# Patient Record
Sex: Female | Born: 1960 | Race: White | Hispanic: No | Smoking: Never smoker
Health system: Southern US, Community
[De-identification: ages and names within clinical notes are randomized; demographics above are authoritative.]

## PROBLEM LIST (undated history)

## (undated) DIAGNOSIS — F419 Anxiety disorder, unspecified: Secondary | ICD-10-CM

## (undated) DIAGNOSIS — F988 Other specified behavioral and emotional disorders with onset usually occurring in childhood and adolescence: Secondary | ICD-10-CM

## (undated) DIAGNOSIS — IMO0001 Reserved for inherently not codable concepts without codable children: Secondary | ICD-10-CM

## (undated) DIAGNOSIS — F32A Depression, unspecified: Secondary | ICD-10-CM

## (undated) DIAGNOSIS — T8859XA Other complications of anesthesia, initial encounter: Secondary | ICD-10-CM

## (undated) DIAGNOSIS — Z973 Presence of spectacles and contact lenses: Secondary | ICD-10-CM

## (undated) DIAGNOSIS — F329 Major depressive disorder, single episode, unspecified: Secondary | ICD-10-CM

## (undated) DIAGNOSIS — R413 Other amnesia: Secondary | ICD-10-CM

## (undated) DIAGNOSIS — C50919 Malignant neoplasm of unspecified site of unspecified female breast: Secondary | ICD-10-CM

## (undated) DIAGNOSIS — T4145XA Adverse effect of unspecified anesthetic, initial encounter: Secondary | ICD-10-CM

## (undated) DIAGNOSIS — IMO0002 Reserved for concepts with insufficient information to code with codable children: Secondary | ICD-10-CM

## (undated) HISTORY — PX: BREAST SURGERY: SHX581

## (undated) HISTORY — DX: Reserved for concepts with insufficient information to code with codable children: IMO0002

## (undated) HISTORY — PX: OTHER SURGICAL HISTORY: SHX169

## (undated) HISTORY — DX: Major depressive disorder, single episode, unspecified: F32.9

## (undated) HISTORY — DX: Other specified behavioral and emotional disorders with onset usually occurring in childhood and adolescence: F98.8

## (undated) HISTORY — DX: Other amnesia: R41.3

## (undated) HISTORY — DX: Anxiety disorder, unspecified: F41.9

## (undated) HISTORY — DX: Depression, unspecified: F32.A

## (undated) HISTORY — DX: Malignant neoplasm of unspecified site of unspecified female breast: C50.919

## (undated) HISTORY — PX: MULTIPLE TOOTH EXTRACTIONS: SHX2053

## (undated) HISTORY — DX: Reserved for inherently not codable concepts without codable children: IMO0001

---

## 1999-12-25 ENCOUNTER — Encounter: Payer: Self-pay | Admitting: Obstetrics and Gynecology

## 1999-12-25 ENCOUNTER — Encounter: Admission: RE | Admit: 1999-12-25 | Discharge: 1999-12-25 | Payer: Self-pay | Admitting: Obstetrics and Gynecology

## 2001-01-20 ENCOUNTER — Encounter: Admission: RE | Admit: 2001-01-20 | Discharge: 2001-01-20 | Payer: Self-pay | Admitting: Obstetrics and Gynecology

## 2001-01-20 ENCOUNTER — Encounter: Payer: Self-pay | Admitting: Obstetrics and Gynecology

## 2002-01-27 ENCOUNTER — Encounter: Admission: RE | Admit: 2002-01-27 | Discharge: 2002-01-27 | Payer: Self-pay | Admitting: Obstetrics and Gynecology

## 2002-01-27 ENCOUNTER — Encounter: Payer: Self-pay | Admitting: Obstetrics and Gynecology

## 2002-03-12 ENCOUNTER — Encounter: Admission: RE | Admit: 2002-03-12 | Discharge: 2002-03-12 | Payer: Self-pay | Admitting: Family Medicine

## 2002-03-12 ENCOUNTER — Encounter: Payer: Self-pay | Admitting: Family Medicine

## 2002-09-01 ENCOUNTER — Encounter: Payer: Self-pay | Admitting: Family Medicine

## 2002-09-01 ENCOUNTER — Encounter: Admission: RE | Admit: 2002-09-01 | Discharge: 2002-09-01 | Payer: Self-pay | Admitting: Family Medicine

## 2003-02-10 ENCOUNTER — Encounter: Admission: RE | Admit: 2003-02-10 | Discharge: 2003-02-10 | Payer: Self-pay | Admitting: Obstetrics and Gynecology

## 2003-02-10 ENCOUNTER — Encounter: Payer: Self-pay | Admitting: Obstetrics and Gynecology

## 2004-02-22 ENCOUNTER — Ambulatory Visit (HOSPITAL_COMMUNITY): Admission: RE | Admit: 2004-02-22 | Discharge: 2004-02-22 | Payer: Self-pay | Admitting: Obstetrics and Gynecology

## 2005-03-06 ENCOUNTER — Ambulatory Visit (HOSPITAL_COMMUNITY): Admission: RE | Admit: 2005-03-06 | Discharge: 2005-03-06 | Payer: Self-pay | Admitting: Obstetrics and Gynecology

## 2006-03-20 ENCOUNTER — Ambulatory Visit (HOSPITAL_COMMUNITY): Admission: RE | Admit: 2006-03-20 | Discharge: 2006-03-20 | Payer: Self-pay | Admitting: Obstetrics and Gynecology

## 2007-03-24 ENCOUNTER — Ambulatory Visit (HOSPITAL_COMMUNITY): Admission: RE | Admit: 2007-03-24 | Discharge: 2007-03-24 | Payer: Self-pay | Admitting: Obstetrics and Gynecology

## 2008-04-13 ENCOUNTER — Ambulatory Visit (HOSPITAL_COMMUNITY): Admission: RE | Admit: 2008-04-13 | Discharge: 2008-04-13 | Payer: Self-pay | Admitting: Obstetrics and Gynecology

## 2009-05-11 ENCOUNTER — Ambulatory Visit (HOSPITAL_COMMUNITY): Admission: RE | Admit: 2009-05-11 | Discharge: 2009-05-11 | Payer: Self-pay | Admitting: Obstetrics and Gynecology

## 2009-08-01 ENCOUNTER — Other Ambulatory Visit: Admission: RE | Admit: 2009-08-01 | Discharge: 2009-08-01 | Payer: Self-pay | Admitting: Gynecology

## 2009-08-01 ENCOUNTER — Encounter: Payer: Self-pay | Admitting: Gynecology

## 2009-08-01 ENCOUNTER — Ambulatory Visit: Payer: Self-pay | Admitting: Gynecology

## 2009-10-25 ENCOUNTER — Ambulatory Visit: Payer: Self-pay | Admitting: Gynecology

## 2010-06-21 ENCOUNTER — Ambulatory Visit (HOSPITAL_COMMUNITY): Admission: RE | Admit: 2010-06-21 | Discharge: 2010-06-21 | Payer: Self-pay | Admitting: Gynecology

## 2010-06-28 ENCOUNTER — Encounter: Admission: RE | Admit: 2010-06-28 | Discharge: 2010-06-28 | Payer: Self-pay | Admitting: Gynecology

## 2010-08-16 ENCOUNTER — Other Ambulatory Visit: Admission: RE | Admit: 2010-08-16 | Discharge: 2010-08-16 | Payer: Self-pay | Admitting: Gynecology

## 2010-08-16 ENCOUNTER — Ambulatory Visit: Payer: Self-pay | Admitting: Gynecology

## 2010-11-12 ENCOUNTER — Encounter: Payer: Self-pay | Admitting: Gynecology

## 2011-05-21 ENCOUNTER — Encounter: Payer: Self-pay | Admitting: Podiatry

## 2011-05-31 ENCOUNTER — Other Ambulatory Visit: Payer: Self-pay | Admitting: Gynecology

## 2011-05-31 DIAGNOSIS — Z1231 Encounter for screening mammogram for malignant neoplasm of breast: Secondary | ICD-10-CM

## 2011-07-05 ENCOUNTER — Ambulatory Visit (HOSPITAL_COMMUNITY)
Admission: RE | Admit: 2011-07-05 | Discharge: 2011-07-05 | Disposition: A | Discharge status: Home / Self Care | Payer: Managed Care, Other (non HMO) | Source: Ambulatory Visit | Attending: Gynecology | Admitting: Gynecology

## 2011-07-05 DIAGNOSIS — Z1231 Encounter for screening mammogram for malignant neoplasm of breast: Secondary | ICD-10-CM | POA: Insufficient documentation

## 2011-07-23 ENCOUNTER — Telehealth: Payer: Self-pay | Admitting: *Deleted

## 2011-07-23 NOTE — Telephone Encounter (Signed)
Pt informed with the below note and will follow up with psychologist.

## 2011-07-23 NOTE — Telephone Encounter (Signed)
The patient really needs to call her psychologist to be evaluated by them. Perimenopausal hormonal changes certainly complaining into emotions and we can discuss this at her annual exam I think if she is acutely stressed she needs to followup with them.

## 2011-07-23 NOTE — Telephone Encounter (Signed)
Pt called and was very emotional. Her annual is scheduled for nov.6. She has recently been diagnosed with bipolar by her psychologist. Pt was crying on phone stating that her marriage is not going well, husband had cancer last year. She also has ADD and on various medication to help with this issue. Pt doesn't believe that she is bipolar and thinks it related to hormone related. She is amenorrheic for about 2 years now. Pt wants your opinion on what she should do to help with this emotional issue. Please advise.

## 2011-08-27 ENCOUNTER — Encounter: Payer: Self-pay | Admitting: *Deleted

## 2011-08-28 ENCOUNTER — Ambulatory Visit (INDEPENDENT_AMBULATORY_CARE_PROVIDER_SITE_OTHER): Payer: Managed Care, Other (non HMO) | Admitting: Gynecology

## 2011-08-28 ENCOUNTER — Encounter: Payer: Self-pay | Admitting: Gynecology

## 2011-08-28 VITALS — BP 130/70 | Ht 65.0 in | Wt 171.0 lb

## 2011-08-28 DIAGNOSIS — Z01419 Encounter for gynecological examination (general) (routine) without abnormal findings: Secondary | ICD-10-CM

## 2011-08-28 DIAGNOSIS — F988 Other specified behavioral and emotional disorders with onset usually occurring in childhood and adolescence: Secondary | ICD-10-CM | POA: Insufficient documentation

## 2011-08-28 DIAGNOSIS — Z780 Asymptomatic menopausal state: Secondary | ICD-10-CM

## 2011-08-28 NOTE — Progress Notes (Signed)
Brandi Chen 1961/09/10 [id number]        50 y.o.  for annual exam.  Having some emotional issues and difficulties adjusting her medication and has an appointment to see a psychologist tomorrow. Had tried HRT last year but discontinued that made her feel funny and did not want to continue it at that time. She is not having any hot flashes or night sweats. She's been amenorrheic for 3 years with a documented FSH of 70 last year.  Past medical history,surgical history, medications, allergies, family history and social history were all reviewed and documented in the EPIC chart. ROS:  Was performed and pertinent positives and negatives are included in the history.  Exam: chaperone present There were no vitals filed for this visit. General appearance  Normal Skin grossly normal Head/Neck normal with no cervical or supraclavicular adenopathy thyroid normal Lungs  clear Cardiac RR, without RMG Abdominal  soft, nontender, without masses, organomegaly or hernia Breasts  examined lying and sitting without masses, retractions, discharge or axillary adenopathy. Pelvic  Ext/BUS/vagina  normal   Cervix  normal    Uterus  anteverted, normal size, shape and contour, midline and mobile nontender   Adnexa  Without masses or tenderness    Anus and perineum  normal   Rectovaginal  normal sphincter tone without palpated masses or tenderness.    Assessment/Plan:  50 y.o. female for annual exam.    1. Psychiatric issues. Patient has a history of ADD questionable bipolar. She is on several medications initially prescribed by Dr. Tenny Craw is now referred to a psychologist and she's to follow up with them tomorrow for medication adjustment. 2. Postmenopausal. I discussed HRT with her again whether this would play into her emotional swings. I reviewed the [facility] study increased risk of stroke heart attack DVT increased risk of breast cancer. The ACOG and NAMS statements of lowest dose for shortness period of time.  She  does not want to try anything now but she will discuss with her psychologist if they feel that this may help her she will call me and we can initiate this. I would suggest since she did not do well with the Vivelle and Prometrium that I will switch her to something different such as Activella. Patient will call she wants to pursue this. 3. Health maintenance. SBE monthly reviewed. She had mammogram September 2012 which is normal continued annual mammography. I suggested she arrange for screening colonoscopy she agrees with this. I ordered a DEXA as a baseline, increased calcium vitamin D reviewed. She has no history of abnormal Pap smears with her last Pap 2011 in the chart. I reviewed current Pap smear screening guidelines we'll plan on every three-year Pap smear and she agrees with this. No Pap is done today. No lab work was done today this is all done through Dr. Charlott Rakes office. Assuming she continues well from a gynecologic standpoint and she will see me in a year again call me if she wants to initiate HRT. She does know when she starts this if she does any bleeding to call me.    [name], 9:53 AM 08/28/2011

## 2011-09-20 ENCOUNTER — Ambulatory Visit (INDEPENDENT_AMBULATORY_CARE_PROVIDER_SITE_OTHER): Payer: Managed Care, Other (non HMO)

## 2011-09-20 DIAGNOSIS — Z13820 Encounter for screening for osteoporosis: Secondary | ICD-10-CM

## 2011-09-20 DIAGNOSIS — Z780 Asymptomatic menopausal state: Secondary | ICD-10-CM

## 2012-05-30 ENCOUNTER — Telehealth: Payer: Self-pay | Admitting: *Deleted

## 2012-05-30 NOTE — Telephone Encounter (Signed)
Pt asked when should she have her mammograms per TF note they should be done yearly. And pap will the in three years. Left on pt voicemail regarding this.

## 2012-06-03 ENCOUNTER — Other Ambulatory Visit: Payer: Self-pay | Admitting: Gynecology

## 2012-06-03 DIAGNOSIS — Z1231 Encounter for screening mammogram for malignant neoplasm of breast: Secondary | ICD-10-CM

## 2012-07-15 ENCOUNTER — Ambulatory Visit (HOSPITAL_COMMUNITY)
Admission: RE | Admit: 2012-07-15 | Discharge: 2012-07-15 | Disposition: A | Discharge status: Home / Self Care | Payer: Managed Care, Other (non HMO) | Source: Ambulatory Visit | Attending: Gynecology | Admitting: Gynecology

## 2012-07-15 DIAGNOSIS — Z1231 Encounter for screening mammogram for malignant neoplasm of breast: Secondary | ICD-10-CM | POA: Insufficient documentation

## 2012-08-13 ENCOUNTER — Telehealth: Payer: Self-pay | Admitting: *Deleted

## 2012-08-13 NOTE — Telephone Encounter (Signed)
Pt called asking if she could have STD screening done, pt informed yes she would need to make OV to see md.

## 2013-06-18 ENCOUNTER — Other Ambulatory Visit: Payer: Self-pay | Admitting: Gynecology

## 2013-06-18 DIAGNOSIS — Z1231 Encounter for screening mammogram for malignant neoplasm of breast: Secondary | ICD-10-CM

## 2013-07-21 ENCOUNTER — Ambulatory Visit (HOSPITAL_COMMUNITY)
Admission: RE | Admit: 2013-07-21 | Discharge: 2013-07-21 | Disposition: A | Discharge status: Home / Self Care | Payer: Managed Care, Other (non HMO) | Source: Ambulatory Visit | Attending: Gynecology | Admitting: Gynecology

## 2013-07-21 ENCOUNTER — Ambulatory Visit (HOSPITAL_COMMUNITY): Payer: Managed Care, Other (non HMO)

## 2013-07-21 DIAGNOSIS — Z1231 Encounter for screening mammogram for malignant neoplasm of breast: Secondary | ICD-10-CM | POA: Insufficient documentation

## 2013-08-06 ENCOUNTER — Encounter: Payer: Self-pay | Admitting: Gynecology

## 2013-08-06 ENCOUNTER — Other Ambulatory Visit (HOSPITAL_COMMUNITY)
Admission: RE | Admit: 2013-08-06 | Discharge: 2013-08-06 | Disposition: A | Discharge status: Home / Self Care | Payer: Managed Care, Other (non HMO) | Source: Ambulatory Visit | Attending: Gynecology | Admitting: Gynecology

## 2013-08-06 ENCOUNTER — Ambulatory Visit (INDEPENDENT_AMBULATORY_CARE_PROVIDER_SITE_OTHER): Payer: Managed Care, Other (non HMO) | Admitting: Gynecology

## 2013-08-06 VITALS — BP 120/76 | Ht 65.0 in | Wt 153.0 lb

## 2013-08-06 DIAGNOSIS — Z01419 Encounter for gynecological examination (general) (routine) without abnormal findings: Secondary | ICD-10-CM | POA: Insufficient documentation

## 2013-08-06 DIAGNOSIS — Z113 Encounter for screening for infections with a predominantly sexual mode of transmission: Secondary | ICD-10-CM

## 2013-08-06 DIAGNOSIS — Z1151 Encounter for screening for human papillomavirus (HPV): Secondary | ICD-10-CM | POA: Insufficient documentation

## 2013-08-06 LAB — RPR

## 2013-08-06 LAB — HIV ANTIBODY (ROUTINE TESTING W REFLEX): HIV: NONREACTIVE

## 2013-08-06 NOTE — Patient Instructions (Addendum)
Schedule colonoscopy with [facility] at [phone] or [facility] at [phone] Follow up in one year for annual exam 

## 2013-08-06 NOTE — Progress Notes (Signed)
Brandi Chen [birth year] [id number]        52 y.o.  G0P0 for annual exam.  Several issues noted below.  Past medical history,surgical history, medications, allergies, family history and social history were all reviewed and documented in the EPIC chart.  ROS:  Performed and pertinent positives and negatives are included in the history, assessment and plan .  Exam: Brandi Chen Filed Vitals:   08/06/13 0824  BP: 120/76  Height: 5\' 5"  (1.651 m)  Weight: 153 lb (69.4 kg)   General appearance  Normal Skin grossly normal Head/Neck normal with no cervical or supraclavicular adenopathy thyroid normal Lungs  clear Cardiac RR, without RMG Abdominal  soft, nontender, without masses, organomegaly or hernia Breasts  examined lying and sitting without masses, retractions, discharge or axillary adenopathy. Pelvic  Ext/BUS/vagina  normal  Cervix  normal, Pap/HPV, GC/Chlamydia  Uterus  anteverted, normal size, shape and contour, midline and mobile nontender   Adnexa  Without masses or tenderness    Anus and perineum  normal   Rectovaginal  normal sphincter tone without palpated masses or tenderness.    Assessment/Plan:  52 y.o. G0P0 female for annual exam.   1. Postmenopausal. Doing well without significant hot flushes, night sweats, vaginal dryness or dyspareunia. No bleeding at all. Will continue to monitor. Patient knows to report any bleeding. 2. STD screening. Patient requests STD screening. Has no known exposure history.  HIV, RPR, hepatitis B, hepatitis C, GC/Chlamydia done. 3. Pap smear/HPV done today. Last Pap smear 2011. No history of abnormal Pap smears previously. 4. Mammography 06/2013. Continue with annual mammography. SBE monthly reviewed. 5. DEXA 2012 normal. Repeat at age 48. Calcium/vitamin D. discussed. 6. Colonoscopy never. I again encouraged her to schedule a screening colonoscopy and names and numbers provided. 7. Health maintenance. No routine blood work done as this  is done through her primary physician's office who she sees routinely. Followup one year, sooner as needed.  Note: This document was prepared with digital dictation and possible smart phrase technology. Any transcriptional errors that result from this process are unintentional.   [name], 8:51 AM 08/06/2013

## 2013-08-06 NOTE — Addendum Note (Signed)
Addended by: Dayna Barker on: 08/06/2013 08:57 AM   Modules accepted: Orders

## 2013-08-07 LAB — URINALYSIS W MICROSCOPIC + REFLEX CULTURE
Bacteria, UA: NONE SEEN
Bilirubin Urine: NEGATIVE
Glucose, UA: NEGATIVE mg/dL
Hgb urine dipstick: NEGATIVE
Leukocytes, UA: NEGATIVE
Specific Gravity, Urine: 1.011 (ref 1.005–1.030)

## 2013-11-05 ENCOUNTER — Other Ambulatory Visit: Payer: Managed Care, Other (non HMO)

## 2013-11-05 ENCOUNTER — Encounter: Payer: Self-pay | Admitting: Gynecology

## 2013-11-05 ENCOUNTER — Other Ambulatory Visit: Payer: Self-pay | Admitting: *Deleted

## 2013-11-05 DIAGNOSIS — Z13220 Encounter for screening for lipoid disorders: Secondary | ICD-10-CM

## 2013-11-05 LAB — LIPID PANEL
Cholesterol: 185 mg/dL (ref 0–200)
HDL: 71 mg/dL (ref >39)
LDL CALC: 92 mg/dL (ref 0–99)
Total CHOL/HDL Ratio: 2.6 Ratio
Triglycerides: 112 mg/dL (ref <150)
VLDL: 22 mg/dL (ref 0–40)

## 2014-04-21 DIAGNOSIS — C50919 Malignant neoplasm of unspecified site of unspecified female breast: Secondary | ICD-10-CM

## 2014-04-21 HISTORY — DX: Malignant neoplasm of unspecified site of unspecified female breast: C50.919

## 2014-04-29 ENCOUNTER — Ambulatory Visit (INDEPENDENT_AMBULATORY_CARE_PROVIDER_SITE_OTHER): Payer: 59 | Admitting: Gynecology

## 2014-04-29 ENCOUNTER — Telehealth: Payer: Self-pay | Admitting: *Deleted

## 2014-04-29 ENCOUNTER — Encounter: Payer: Self-pay | Admitting: Gynecology

## 2014-04-29 DIAGNOSIS — N63 Unspecified lump in unspecified breast: Secondary | ICD-10-CM

## 2014-04-29 DIAGNOSIS — N6310 Unspecified lump in the right breast, unspecified quadrant: Secondary | ICD-10-CM

## 2014-04-29 NOTE — Telephone Encounter (Signed)
Message copied by Thamas Jaegers on Thu Apr 29, 2014  2:38 PM ------      Message from: Ramond Craver      Created: Thu Apr 29, 2014  2:30 PM      Regarding: referral for mammo                   ----- Message -----         From: [name], MD         Sent: 04/29/2014   2:27 PM           To: Ramond Craver            Patient needs diagnostic mammogram and ultrasound reference new onset right breast mass 9:00 position of the areola. Patient is very, very anxious so the soonest the better. ------

## 2014-04-29 NOTE — Telephone Encounter (Signed)
Appointment 05/05/14 @ 12:30 pm left on pt voicemail appointment time and date and breast center with # to call

## 2014-04-29 NOTE — Patient Instructions (Signed)
Office will contact you to arrange mammogram/ultrasound.

## 2014-04-29 NOTE — Progress Notes (Signed)
Patient ID: Brandi Chen, female   DOB: 1961-02-05, 53 y.o.   MRN: [id number] Brandi Chen 1961/01/20 [id number]        53 y.o.  G0P0 presents complaining of new-onset right breast mass noticed this morning in the shower. No history of same before. Does not hurt. No nipple discharge. Mammogram 06/2013 normal.  Past medical history,surgical history, problem list, medications, allergies, family history and social history were all reviewed and documented in the EPIC chart.  Directed ROS with pertinent positives and negatives documented in the history of present illness/assessment and plan.  Exam: [name] General appearance:  Normal Both breasts examined lying and sitting. Left without masses, retractions, discharge, adenopathy. Right with grape size firm mobile mass 9:00 position of the areola. No overlying skin changes, nipple discharge or axillary adenopathy.  Procedure: Skin overlying the area was cleansed with alcohol, 1% lidocaine infiltration and attempted aspiration times several passes clearly within the mass without return. Pressure Band-Aid applied afterwards.  Assessment/Plan:  53 y.o. G0P0 with new onset right breast mass. Firm and mobile. Arrange diagnostic mammography and ultrasound. Various scenarios were reviewed with her to include aspiration under ultrasound guidance if cyst, needle biopsy of solid or excisional biopsy.   Note: This document was prepared with digital dictation and possible smart phrase technology. Any transcriptional errors that result from this process are unintentional.   [name] MD, 2:28 PM 04/29/2014

## 2014-05-03 ENCOUNTER — Ambulatory Visit
Admission: RE | Admit: 2014-05-03 | Discharge: 2014-05-03 | Disposition: A | Discharge status: Home / Self Care | Payer: 59 | Source: Ambulatory Visit | Attending: Gynecology | Admitting: Gynecology

## 2014-05-03 ENCOUNTER — Other Ambulatory Visit: Payer: Self-pay | Admitting: Gynecology

## 2014-05-03 DIAGNOSIS — N6310 Unspecified lump in the right breast, unspecified quadrant: Secondary | ICD-10-CM

## 2014-05-04 ENCOUNTER — Ambulatory Visit
Admission: RE | Admit: 2014-05-04 | Discharge: 2014-05-04 | Disposition: A | Discharge status: Home / Self Care | Payer: 59 | Source: Ambulatory Visit | Attending: Gynecology | Admitting: Gynecology

## 2014-05-04 ENCOUNTER — Other Ambulatory Visit: Payer: Self-pay | Admitting: Gynecology

## 2014-05-04 DIAGNOSIS — N6310 Unspecified lump in the right breast, unspecified quadrant: Secondary | ICD-10-CM

## 2014-05-05 ENCOUNTER — Other Ambulatory Visit: Payer: Self-pay | Admitting: Gynecology

## 2014-05-05 ENCOUNTER — Other Ambulatory Visit: Payer: Managed Care, Other (non HMO)

## 2014-05-05 DIAGNOSIS — C50919 Malignant neoplasm of unspecified site of unspecified female breast: Secondary | ICD-10-CM

## 2014-05-06 ENCOUNTER — Telehealth: Payer: Self-pay | Admitting: *Deleted

## 2014-05-06 DIAGNOSIS — C50411 Malignant neoplasm of upper-outer quadrant of right female breast: Secondary | ICD-10-CM

## 2014-05-06 NOTE — Telephone Encounter (Signed)
Confirmed [facility] for 05/12/14 at 8am .  Instructions and contact information given.

## 2014-05-06 NOTE — Telephone Encounter (Signed)
Left message for a return phone call to schedule patient for [facility] 05/12/14.

## 2014-05-07 ENCOUNTER — Encounter: Payer: Self-pay | Admitting: Oncology

## 2014-05-07 NOTE — Progress Notes (Signed)
Called and spoke with patient and she wanted me to know that her insurance was asking for some info and she was not sure if she should send in. I advised I had no idea what would happen if she didn't so she needs to get with them to make sure of all what is needed so she won't be denied any services. She said they told her she would not be dropped as long as she paid her premium.

## 2014-05-11 ENCOUNTER — Other Ambulatory Visit: Payer: 59

## 2014-05-11 ENCOUNTER — Ambulatory Visit
Admission: RE | Admit: 2014-05-11 | Discharge: 2014-05-11 | Disposition: A | Discharge status: Home / Self Care | Payer: 59 | Source: Ambulatory Visit | Attending: Gynecology | Admitting: Gynecology

## 2014-05-11 DIAGNOSIS — C50919 Malignant neoplasm of unspecified site of unspecified female breast: Secondary | ICD-10-CM

## 2014-05-12 ENCOUNTER — Encounter: Payer: Self-pay | Admitting: Dietician

## 2014-05-12 ENCOUNTER — Encounter: Payer: Self-pay | Admitting: *Deleted

## 2014-05-12 ENCOUNTER — Ambulatory Visit (HOSPITAL_BASED_OUTPATIENT_CLINIC_OR_DEPARTMENT_OTHER): Payer: 59 | Admitting: Oncology

## 2014-05-12 ENCOUNTER — Ambulatory Visit: Payer: 59

## 2014-05-12 ENCOUNTER — Encounter: Payer: Self-pay | Admitting: Oncology

## 2014-05-12 ENCOUNTER — Other Ambulatory Visit (HOSPITAL_BASED_OUTPATIENT_CLINIC_OR_DEPARTMENT_OTHER): Payer: 59

## 2014-05-12 ENCOUNTER — Ambulatory Visit
Admission: RE | Admit: 2014-05-12 | Discharge: 2014-05-12 | Disposition: A | Discharge status: Home / Self Care | Payer: 59 | Source: Ambulatory Visit | Attending: Radiation Oncology | Admitting: Radiation Oncology

## 2014-05-12 ENCOUNTER — Ambulatory Visit (HOSPITAL_BASED_OUTPATIENT_CLINIC_OR_DEPARTMENT_OTHER): Payer: 59 | Admitting: General Surgery

## 2014-05-12 ENCOUNTER — Ambulatory Visit: Payer: 59 | Attending: General Surgery | Admitting: Physical Therapy

## 2014-05-12 VITALS — BP 139/90 | HR 76 | Temp 98.7°F | Resp 20 | Ht 65.0 in | Wt 155.1 lb

## 2014-05-12 DIAGNOSIS — F3289 Other specified depressive episodes: Secondary | ICD-10-CM | POA: Insufficient documentation

## 2014-05-12 DIAGNOSIS — C50411 Malignant neoplasm of upper-outer quadrant of right female breast: Secondary | ICD-10-CM

## 2014-05-12 DIAGNOSIS — C50919 Malignant neoplasm of unspecified site of unspecified female breast: Secondary | ICD-10-CM | POA: Insufficient documentation

## 2014-05-12 DIAGNOSIS — F329 Major depressive disorder, single episode, unspecified: Secondary | ICD-10-CM | POA: Diagnosis not present

## 2014-05-12 DIAGNOSIS — C50419 Malignant neoplasm of upper-outer quadrant of unspecified female breast: Secondary | ICD-10-CM

## 2014-05-12 DIAGNOSIS — F411 Generalized anxiety disorder: Secondary | ICD-10-CM | POA: Diagnosis not present

## 2014-05-12 DIAGNOSIS — Z170 Estrogen receptor positive status [ER+]: Secondary | ICD-10-CM

## 2014-05-12 DIAGNOSIS — R293 Abnormal posture: Secondary | ICD-10-CM | POA: Diagnosis not present

## 2014-05-12 DIAGNOSIS — IMO0001 Reserved for inherently not codable concepts without codable children: Secondary | ICD-10-CM | POA: Diagnosis present

## 2014-05-12 LAB — CBC WITH DIFFERENTIAL/PLATELET
BASO%: 0.6 % (ref 0.0–2.0)
BASOS ABS: 0 10*3/uL (ref 0.0–0.1)
EOS%: 1 % (ref 0.0–7.0)
Eosinophils Absolute: 0.1 10*3/uL (ref 0.0–0.5)
HEMATOCRIT: 40.8 % (ref 34.8–46.6)
HEMOGLOBIN: 13.5 g/dL (ref 11.6–15.9)
LYMPH#: 1.5 10*3/uL (ref 0.9–3.3)
LYMPH%: 27.8 % (ref 14.0–49.7)
MCH: 31.5 pg (ref 25.1–34.0)
MCHC: 33.2 g/dL (ref 31.5–36.0)
MCV: 95 fL (ref 79.5–101.0)
MONO#: 0.6 10*3/uL (ref 0.1–0.9)
MONO%: 11.9 % (ref 0.0–14.0)
NEUT#: 3.2 10*3/uL (ref 1.5–6.5)
NEUT%: 58.7 % (ref 38.4–76.8)
PLATELETS: 220 10*3/uL (ref 145–400)
RBC: 4.29 10*6/uL (ref 3.70–5.45)
RDW: 11.8 % (ref 11.2–14.5)
WBC: 5.5 10*3/uL (ref 3.9–10.3)

## 2014-05-12 LAB — COMPREHENSIVE METABOLIC PANEL (CC13)
ALT: 15 U/L (ref 0–55)
AST: 15 U/L (ref 5–34)
Albumin: 3.9 g/dL (ref 3.5–5.0)
Alkaline Phosphatase: 54 U/L (ref 40–150)
Anion Gap: 12 mEq/L — ABNORMAL HIGH (ref 3–11)
BUN: 12.4 mg/dL (ref 7.0–26.0)
CALCIUM: 9.7 mg/dL (ref 8.4–10.4)
CHLORIDE: 102 meq/L (ref 98–109)
CO2: 28 mEq/L (ref 22–29)
Creatinine: 0.8 mg/dL (ref 0.6–1.1)
Glucose: 98 mg/dl (ref 70–140)
Potassium: 4.5 mEq/L (ref 3.5–5.1)
Sodium: 141 mEq/L (ref 136–145)
Total Bilirubin: 0.48 mg/dL (ref 0.20–1.20)
Total Protein: 7.1 g/dL (ref 6.4–8.3)

## 2014-05-12 NOTE — Assessment & Plan Note (Addendum)
Pt has a clinical T2N0 left breast cancer.   We will plan to get an Oncotype on the core needle biopsy.  If this is high, she will need a port for neoadjuvant chemotherapy.  If low, she will get neoadjuvant hormones.  She is less likely to have a good response to hormone treatment alone.  If she does not appear to be responding, we may do surgery after the intermediate imaging.    I reviewed port a cath with the patient in terms of risks and benefits.  I discussed the risk of PTX, bleeding, malfunction, infection with the patient.    She would need mastectomy without any response due to the tumor size/breast size ratio.  She may also benefit from seeing a plastic surgeon for possible reduction mammoplasty on the left and mastopexy on the right.  We will discuss this after seeing what her response is.  She would like to attempt breast conservation if at all possible.  There is no evidence of lymph node involvement.    45 min spent in evaluation, examination, counseling, and coordination of care.  >50% spent in counseling.

## 2014-05-12 NOTE — Progress Notes (Signed)
[facility]  [phone] Fax:(336) [phone]     ID: [name] DOB: [birth year]  MR#: [id number]  EYC#:[id number]  Patient Care Team: [name], MD as PCP - General (Family Medicine) [name], MD as Consulting Physician (General Surgery) Chauncey Cruel, MD as Consulting Physician (Oncology) Thea Silversmith, MD as Consulting Physician (Radiation Oncology) [name], MD as Consulting Physician (Gynecology)  CHIEF COMPLAINT: Newly diagnosed breast cancer  CURRENT TREATMENT: To start neoadjuvant therapy   BREAST CANCER HISTORY: Brandi Chen herself noted a change in her right breast while putting on a bathing suit. She immediately contacted Dr. Phineas Real, who works here in and confirmed a mass in the upper outer quadrant of the right breast, which she was unable to aspirate. He set the patient up for mammography at the breast Center 05/03/2014, and this confirmed a mass with lobulated margins in the upper outer quadrant which was easily palpable and which by ultrasound was hypoechoic and microlobulated, measuring 3.7 cm. The right axilla was unremarkable.  Biopsy of this mass 05/04/2014 showed ([id number]) and invasive ductal carcinoma, grade 2, estrogen and progesterone receptor positive, with an MIB-1 of 15%, and no HER-2 amplification, the signals ratio being 0.94 and the number per cell 1.70.  The patient's subsequent history is as detailed below.  INTERVAL HISTORY: Brandi Chen was evaluated in the multidisciplinary breast cancer clinic 05/12/2014 accompanied by her sister Erby Pian and the patient's ex-husband Barnabas Lister.  REVIEW OF SYSTEMS: Aside from the mass itself there were no worrisome symptoms leading to the diagnostic mammogram. The patient denies unusual headaches, visual changes, nausea, vomiting, stiff neck, dizziness, or gait imbalance. There has been no cough, phlegm production, or pleurisy, no chest pain or pressure, and no change in bowel or  bladder habits. The patient denies fever, rash, bleeding, unexplained fatigue or unexplained weight loss. She admits to anxiety and depression associated with her new diagnosis. A detailed review of systems was otherwise entirely negative.  PAST MEDICAL HISTORY: Past Medical History  Diagnosis Date  . ADD (attention deficit disorder)   . Anxiety   . Basal cell cancer   . Breast cancer   . Depression     PAST SURGICAL HISTORY: Past Surgical History  Procedure Laterality Date  . Basal cell excised      FAMILY HISTORY Family History  Problem Relation Age of Onset  . Cancer Mother     bladder  . Hypertension Father   . Breast cancer Paternal Aunt 52  . Cancer Maternal Grandmother     ovarian/uterine   the patient's father died at the age of 23 either from a myocardial infarction or a stroke. The patient's mother died at the age of 27 from metastatic bladder cancer. She was a heavy smoker. The patient has one brother and one sister. There is no other cancer in the immediate family, although on the mother's side 1 and was diagnosed with what may have been cervical or ovarian cancer late in life, and on the father's side there was an aunt with breast cancer diagnosed in her late 92s  GYNECOLOGIC HISTORY:  Patient's last menstrual period was 08/27/2008. Menarche age 15, the patient is GX P0. She stopped having periods in 2009. She did not take hormone replacement. She did take Depo-Provera for many years because of heavy periods remotely.  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 She is starting her on business which  includes dog sitting, painting, doing grocery shopping, and generally being a "girl Friday". She is divorced and lives with a roommate [name] who is disabled secondary to fibromyalgia. The patient's husband Barnabas Lister, who is a survivor of colon cancer, has his own locksmith business. The patient's sister Erby Pian is  a stay-at-home mom here in [place]. The patient's brother lives in [place] where he works in conservation. The patient attends a [employer]    ADVANCED DIRECTIVES: Not in place. At the time of the patient's 05/12/2014 visit she was given the patient was given the appropriate documents to complete and notarize at her discretion   HEALTH MAINTENANCE: History  Substance Use Topics  . Smoking status: Never Smoker   . Smokeless tobacco: Never Used  . Alcohol Use: 4.2 oz/week    7 Glasses of wine per week     Comment: social     Colonoscopy:  PAP:  Bone density:  Lipid panel:  No Known Allergies  Current Outpatient Prescriptions  Medication Sig Dispense Refill  . ALPRAZolam (XANAX PO) Take by mouth.      Marland Kitchen amphetamine-dextroamphetamine (ADDERALL) 10 MG tablet Take 20 mg by mouth 2 (two) times daily.        . clonazePAM (KLONOPIN) 0.5 MG tablet Take 0.5 mg by mouth 2 (two) times daily as needed for anxiety.      . divalproex (DEPAKOTE) 250 MG DR tablet Take 250 mg by mouth 3 (three) times daily.      Marland Kitchen glucosamine-chondroitin 500-400 MG tablet Take 1 tablet by mouth 3 (three) times daily.         No current facility-administered medications for this visit.    OBJECTIVE: Middle-aged white woman who appears stated age 53 Vitals:   05/12/14 0850  BP: 139/90  Pulse: 76  Temp: 98.7 F (37.1 C)  Resp: 20     Body mass index is 25.81 kg/(m^2).    ECOG FS:0 - Asymptomatic  Ocular: Sclerae unicteric, pupils equal, round and reactive to light Ear-nose-throat: Oropharynx clear and moist Lymphatic: No cervical or supraclavicular adenopathy Lungs no rales or rhonchi, good excursion bilaterally Heart regular rate and rhythm, no murmur appreciated Abd soft, nontender, positive bowel sounds MSK no focal spinal tenderness, no joint edema Neuro: non-focal, well-oriented, appropriate affect Breasts: The right breast is status post recent biopsy. There is a significant  ecchymosis involving the lower half of the breast. In the upper outer quadrant and laterally there is a movable mass measuring approximately 4 cm by palpation. There is no other skin or nipple change of concern. The right axilla is benign. The left breast is unremarkable   LAB RESULTS:  CMP     Component Value Date/Time   NA 141 05/12/2014 0806   K 4.5 05/12/2014 0806   CO2 28 05/12/2014 0806   GLUCOSE 98 05/12/2014 0806   BUN 12.4 05/12/2014 0806   CREATININE 0.8 05/12/2014 0806   CALCIUM 9.7 05/12/2014 0806   PROT 7.1 05/12/2014 0806   ALBUMIN 3.9 05/12/2014 0806   AST 15 05/12/2014 0806   ALT 15 05/12/2014 0806   ALKPHOS 54 05/12/2014 0806   BILITOT 0.48 05/12/2014 0806    I No results found for this basename: SPEP,  UPEP,   kappa and lambda light chains    Lab Results  Component Value Date   WBC 5.5 05/12/2014   NEUTROABS 3.2 05/12/2014   HGB 13.5 05/12/2014   HCT 40.8 05/12/2014   MCV 95.0 05/12/2014  PLT 220 05/12/2014      Chemistry      Component Value Date/Time   NA 141 05/12/2014 0806   K 4.5 05/12/2014 0806   CO2 28 05/12/2014 0806   BUN 12.4 05/12/2014 0806   CREATININE 0.8 05/12/2014 0806      Component Value Date/Time   CALCIUM 9.7 05/12/2014 0806   ALKPHOS 54 05/12/2014 0806   AST 15 05/12/2014 0806   ALT 15 05/12/2014 0806   BILITOT 0.48 05/12/2014 0806       No results found for this basename: LABCA2    No components found with this basename: LABCA125    No results found for this basename: INR,  in the last 168 hours  Urinalysis    Component Value Date/Time   COLORURINE YELLOW 08/06/2013 [place] 08/06/2013 0852   LABSPEC 1.011 08/06/2013 0852   PHURINE 6.0 08/06/2013 0852   GLUCOSEU NEG 08/06/2013 0852   HGBUR NEG 08/06/2013 0852   BILIRUBINUR NEG 08/06/2013 0852   KETONESUR NEG 08/06/2013 0852   PROTEINUR NEG 08/06/2013 0852   UROBILINOGEN 0.2 08/06/2013 0852   NITRITE NEG 08/06/2013 0852   LEUKOCYTESUR NEG 08/06/2013 0852     STUDIES: Mm Digital Diagnostic Unilat L  05/11/2014   CLINICAL DATA:  Patient presented with palpable right breast mass earlier this month, recent diagnosis of right breast cancer, pathology invasive ductal carcinoma, ultrasound core biopsy on 05/03/2014. Updated left mammogram requested prior to pre-treatment MRI.  EXAM: DIGITAL DIAGNOSTIC  LEFT MAMMOGRAM WITH CAD  COMPARISON:  07/21/2013, 07/15/2012, dating back to 03/20/2006.  ACR Breast Density Category c: The breast tissue is heterogeneously dense, which may obscure small masses.  FINDINGS: CC and MLO views of the left breast were obtained. No findings suspicious for malignancy in the left breast.  Mammographic images were processed with CAD.  IMPRESSION: No mammographic evidence of malignancy, left breast.  RECOMMENDATION: Treatment plan for the newly diagnosed right breast cancer.  I have discussed the findings and recommendations with the patient. Results were also provided in writing at the conclusion of the visit. If applicable, a reminder letter will be sent to the patient regarding the next appointment.  BI-RADS CATEGORY  1: Negative.   Electronically Signed   By: Evangeline Dakin M.D.   On: 05/11/2014 09:13   Mm Digital Diagnostic Unilat R  05/04/2014   CLINICAL DATA:  Right breast mass  EXAM: DIAGNOSTIC RIGHT MAMMOGRAM POST ULTRASOUND BIOPSY  COMPARISON:  Previous exams  FINDINGS: Mammographic images were obtained following ultrasound guided biopsy of a mass in the 10 o'clock anterior right breast. Marker clip projects over the mass.  IMPRESSION: Ribbon shaped marker clip in appropriate position  Final Assessment: Post Procedure Mammograms for Marker Placement   Electronically Signed   By: Skipper Cliche M.D.   On: 05/04/2014 11:23   Mm Digital Diagnostic Unilat R  05/03/2014   CLINICAL DATA:  Palpable right breast mass  EXAM: DIGITAL DIAGNOSTIC  RIGHT MAMMOGRAM WITH CAD  ULTRASOUND RIGHT BREAST  COMPARISON:  All prior studies dating  back to 2007  ACR Breast Density Category b: There are scattered areas of fibroglandular density.  FINDINGS: There is a hyper attenuating round mass with lobulated margins anteriorly in the upper outer quadrant of the right breast. There are regional punctate calcifications throughout the anterior right breast, which are not significantly changed when compared to prior studies dating back to 2009.  Mammographic images were processed with CAD.  On physical exam, there  is a very firm superficial mass elevating the scan 1 cm from the nipple in the 10 o'clock position of the right breast.  Ultrasound is performed, showing a round mass corresponding to the palpable abnormality. It is homogeneously hypoechoic with increased sound transmission and anti parallel orientation. It shows microlobulated borders and measures 37 x 24 x 24 mm. There appears to be an intraductal/satellite component extending toward the 11 and 12 o'clock position.  Ultrasound of the right axilla reveals normal lymph node showing normal size and normal hila with no cortical thickening.  IMPRESSION: Highly suspicious mass right breast  RECOMMENDATION: The patient is returning for ultrasound-guided core needle biopsy on May 04, 2014 at 10 a.m.  I have discussed the findings and recommendations with the patient. Results were also provided in writing at the conclusion of the visit. If applicable, a reminder letter will be sent to the patient regarding the next appointment.  BI-RADS CATEGORY  5: Highly suggestive of malignancy.   Electronically Signed   By: Skipper Cliche M.D.   On: 05/03/2014 16:06   US Breast Ltd Uni Right Inc Axilla  05/03/2014   CLINICAL DATA:  Palpable right breast mass  EXAM: DIGITAL DIAGNOSTIC  RIGHT MAMMOGRAM WITH CAD  ULTRASOUND RIGHT BREAST  COMPARISON:  All prior studies dating back to 2007  ACR Breast Density Category b: There are scattered areas of fibroglandular density.  FINDINGS: There is a hyper attenuating round mass  with lobulated margins anteriorly in the upper outer quadrant of the right breast. There are regional punctate calcifications throughout the anterior right breast, which are not significantly changed when compared to prior studies dating back to 2009.  Mammographic images were processed with CAD.  On physical exam, there is a very firm superficial mass elevating the scan 1 cm from the nipple in the 10 o'clock position of the right breast.  Ultrasound is performed, showing a round mass corresponding to the palpable abnormality. It is homogeneously hypoechoic with increased sound transmission and anti parallel orientation. It shows microlobulated borders and measures 37 x 24 x 24 mm. There appears to be an intraductal/satellite component extending toward the 11 and 12 o'clock position.  Ultrasound of the right axilla reveals normal lymph node showing normal size and normal hila with no cortical thickening.  IMPRESSION: Highly suspicious mass right breast  RECOMMENDATION: The patient is returning for ultrasound-guided core needle biopsy on May 04, 2014 at 10 a.m.  I have discussed the findings and recommendations with the patient. Results were also provided in writing at the conclusion of the visit. If applicable, a reminder letter will be sent to the patient regarding the next appointment.  BI-RADS CATEGORY  5: Highly suggestive of malignancy.   Electronically Signed   By: Skipper Cliche M.D.   On: 05/03/2014 16:06   [place] Rt Breast Bx W Loc Dev 1st Lesion Img Bx Spec US Guide  05/05/2014   ADDENDUM REPORT: 05/05/2014 15:05  ADDENDUM: Pathologic results have become available and indicate invasive ductal carcinoma. This is concordant with imaging findings. The patient has an appointment scheduled with the multidisciplinary Clinic on May 12, 2014. Given the size of the mass, plus the concern for an intraductal component, MRI is being scheduled for the patient. I gave these results to the patient on May 05, 2014 at  1300 hours. We discussed multi disciplinary clinic and MRI. I answered the patient's questions. She indicated no complaints or complications related to the biopsy.   Electronically Signed  By: Skipper Cliche M.D.   On: 05/05/2014 15:05   05/05/2014   CLINICAL DATA:  Mass periareolar 10 o'clock position right breast  EXAM: ULTRASOUND GUIDED RIGHT BREAST CORE NEEDLE BIOPSY  COMPARISON:  Previous exams.  FINDINGS: I met with the patient and we discussed the procedure of ultrasound-guided biopsy, including benefits and alternatives. We discussed the high likelihood of a successful procedure. We discussed the risks of the procedure, including infection, bleeding, tissue injury, clip migration, and inadequate sampling. Informed written consent was given. The usual time-out protocol was performed immediately prior to the procedure.  Using sterile technique and 2% Lidocaine as local anesthetic, under direct ultrasound visualization, a 12 gauge spring-loaded device was used to perform biopsy of right breast mass using an inferolateral to superomedial approach. At the conclusion of the procedure a tissue marker clip was deployed into the biopsy cavity. Follow up 2 view mammogram was performed and dictated separately.  IMPRESSION: Ultrasound guided biopsy of right breast mass. No apparent complications.  Electronically Signed: By: Skipper Cliche M.D. On: 05/04/2014 10:56    ASSESSMENT: 53 y.o. [place] woman status post right upper outer quadrant breast biopsy 05/04/2014 for a clinical T2 N0, stage IIA invasive ductal carcinoma, grade 2, estrogen and progesterone receptor positive, HER-2 not amplified, with an MIB-1 of 15%  PLAN: We spent the better part of today's hour-long appointment discussing the biology of breast cancer in general, and the specifics of the patient's tumor in particular. Clair Gulling understands the difference between local and systemic treatment for breast cancer. As far as local treatment is  concerned, there is no survival difference between mastectomy and lumpectomy followed by radiation. The patient is interested in breast conservation, which is our recommendation.  As far as systemic therapy is concerned she will clearly benefit from antiestrogen. She will just is clearly not receive any benefit from anti-HER-2 treatment. The more complicated question is the potential benefit from chemotherapy. What NCCN guidelines suggest for patients in this situation is that an Oncotype be sent and if the patient's cancer falls in the low risk category that endocrine therapy be the only systemic treatment. In Kim's case if the tumor proves to be in the intermediate or high risk "boxes" she would receive chemotherapy.  We also feel that neoadjuvant treatment would be helpful in terms of optimizing the surgery both in terms of cosmesis and questions like margins. The issue then is whether she will need neoadjuvant chemotherapy or neoadjuvant hormone therapy. She is going to return to see me in about 2 weeks, by which time we should have the Oncotype report and should be able to operationalized either one of those options  The patient has a good understanding of the overall plan. She agrees with it. She knows the goal of treatment in her case is cure. She will call with any problems that may develop before her next visit here.  Chauncey Cruel, MD   05/12/2014 10:58 AM

## 2014-05-12 NOTE — Progress Notes (Signed)
[place] Psychosocial Distress Screening Clinical Social Work   Patient completed distress screening protocol, and scored scored a 10 on the Psychosocial Distress Thermometer which indicates severe distress. Clinical Social Worker met with pt in [facility] to assess for distress and other psychosocial needs.  Pt stated her distress level was much lower after meeting with the treatment team and getting more information on her diagnosis and treatment plan.  Pt did express some emotional concerns and was open to support services.  CSW provided a space for pt to express other concerns in her life that are causing her additional stress.  CSW and pt then identified coping techniques to reduce and manage her stress.  CSW provided pt with information on the support team and support services, and pt was open to attending support group.  CSW provided contact information and encouraged her to call with any questions or concerns.       ONCBCN DISTRESS SCREENING 05/12/2014  Screening Type Initial Screening  [place] the number that describes how much distress you have been experiencing in the past week 10  Practical problem type Insurance  Family Problem type Other (comment)  Emotional problem type Depression;Nervousness/Anxiety;Adjusting to illness;Isolation/feeling alone;Feeling hopeless;Adjusting to appearance changes  Spiritual/Religous concerns type Facing my mortality;Loss of sense of purpose  Information Concerns Type Lack of info about treatment  Physical Problem type Nausea/vomiting;Bathing/dressing;Other (comment)  Physician notified of physical symptoms Yes  Referral to clinical psychology No  Referral to clinical social work Yes  Referral to dietition No  Referral to financial advocate No  Referral to support programs No  Referral to palliative care No  Other physical apperance    Johnnye Lana, MSW, [place] [phone]

## 2014-05-12 NOTE — Progress Notes (Signed)
Chief complaint:  New right breast cancer  HISTORY: The patient is a 53 year old female referred by Dr. [name] for consultation of new breast cancer.  She presented with a palpable right breast mass.  She also noted that the contour of the right breast was different.  She underwent imaging and was seen to have a 3.7 x 2.4 x 2.4 cm lesion at the 10 o'clock position.  Ultrasound was concordant.  She did not get an MRI due to the high copay.  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  Ki67 was 15%.  She is post menopausal.  She had menarche at age 62 and menopause at age 33.  She did use OCPs for a while.  She has not had any children.    She has a family history of bladder cancer in her mother, cervical cancer in a maternal relative, and breast cancer in paternal relative.    Past Medical History  Diagnosis Date  . ADD (attention deficit disorder)   . Anxiety   . Basal cell cancer   . Breast cancer   . Depression     Past Surgical History  Procedure Laterality Date  . Basal cell excised      Current Outpatient Prescriptions  Medication Sig Dispense Refill  . ALPRAZolam (XANAX PO) Take by mouth.      Marland Kitchen amphetamine-dextroamphetamine (ADDERALL) 10 MG tablet Take 20 mg by mouth 2 (two) times daily.        . clonazePAM (KLONOPIN) 0.5 MG tablet Take 0.5 mg by mouth 2 (two) times daily as needed for anxiety.      . divalproex (DEPAKOTE) 250 MG DR tablet Take 250 mg by mouth 3 (three) times daily.      Marland Kitchen glucosamine-chondroitin 500-400 MG tablet Take 1 tablet by mouth 3 (three) times daily.         No current facility-administered medications for this visit.     No Known Allergies   Family History  Problem Relation Age of Onset  . Cancer Mother     bladder  . Hypertension Father   . Breast cancer Paternal Aunt 43  . Cancer Maternal Grandmother     ovarian/uterine     History   Social History  . Marital Status: Married     Spouse Name: N/A    Number of Children: N/A  . Years of Education: N/A   Social History Main Topics  . Smoking status: Never Smoker   . Smokeless tobacco: Never Used  . Alcohol Use: 4.2 oz/week    7 Glasses of wine per week     Comment: social  . Drug Use: No  . Sexual Activity: Yes    Partners: Male    Birth Control/ Protection: Post-menopausal   Other Topics Concern  . Not on file   Social History Narrative  . No narrative on file     REVIEW OF SYSTEMS - PERTINENT POSITIVES ONLY: 12 point review of systems negative other than HPI and PMH except for depression, anxiety, breast lump.    EXAM: There were no vitals filed for this visit.  Wt Readings from Last 3 Encounters:  05/12/14 155 lb 1.6 oz (70.353 kg)  08/06/13 153 lb (69.4 kg)  08/28/11 171 lb (77.565 kg)   Wt Readings from Last 3 Encounters:  05/12/14 155 lb 1.6 oz (70.353 kg)  08/06/13 153 lb (69.4 kg)  08/28/11 171 lb (77.565 kg)   Temp Readings from Last  3 Encounters:  05/12/14 98.7 F (37.1 C) Oral   BP Readings from Last 3 Encounters:  05/12/14 139/90  08/06/13 120/76  08/28/11 130/70   Pulse Readings from Last 3 Encounters:  05/12/14 76     Gen:  No acute distress.  Well nourished and well groomed.   Neurological: Alert and oriented to person, place, and time. Coordination normal.  Head: Normocephalic and atraumatic.  Eyes: Conjunctivae are normal. Pupils are equal, round, and reactive to light. No scleral icterus.  Neck: Normal range of motion. Neck supple. No tracheal deviation or thyromegaly present.  Cardiovascular: Normal rate, regular rhythm, normal heart sounds and intact distal pulses.  Exam reveals no gallop and no friction rub.  No murmur heard. Breast: right breast has palpable mass at 10 o'clock.  This deforms the contour of the breast.  It feels quite superficial, but does not have any inflammatory changes of skin, and does not seem to invade the skin.  There is no nipple  retraction or skin dimpling.  There is no palpable lymphadenopathy.  The breasts are ptotic bilaterally.  There is no abnormality palpable on the left.  There is significant biopsy hematoma on the right.   Respiratory: Effort normal.  No respiratory distress. No chest wall tenderness. Breath sounds normal.  No wheezes, rales or rhonchi.  GI: Soft. Bowel sounds are normal. The abdomen is soft and nontender.  There is no rebound and no guarding.  Musculoskeletal: Normal range of motion. Extremities are nontender.  Lymphadenopathy: No cervical, preauricular, postauricular or axillary adenopathy is present Skin: Skin is warm and dry. No rash noted. No diaphoresis. No erythema. No pallor. No clubbing, cyanosis, or edema.   Psychiatric: Normal mood and affect. Behavior is normal. Judgment and thought content normal.    LABORATORY RESULTS: Available labs are reviewed   Recent Results (from the past 2160 hour(s))  CBC WITH DIFFERENTIAL     Status: None   Collection Time    05/12/14  8:06 AM      Result Value Ref Range   WBC 5.5  3.9 - 10.3 10e3/uL   NEUT# 3.2  1.5 - 6.5 10e3/uL   HGB 13.5  11.6 - 15.9 g/dL   HCT 40.8  34.8 - 46.6 %   Platelets 220  145 - 400 10e3/uL   MCV 95.0  79.5 - 101.0 fL   MCH 31.5  25.1 - 34.0 pg   MCHC 33.2  31.5 - 36.0 g/dL   RBC 4.29  3.70 - 5.45 10e6/uL   RDW 11.8  11.2 - 14.5 %   lymph# 1.5  0.9 - 3.3 10e3/uL   MONO# 0.6  0.1 - 0.9 10e3/uL   Eosinophils Absolute 0.1  0.0 - 0.5 10e3/uL   Basophils Absolute 0.0  0.0 - 0.1 10e3/uL   NEUT% 58.7  38.4 - 76.8 %   LYMPH% 27.8  14.0 - 49.7 %   MONO% 11.9  0.0 - 14.0 %   EOS% 1.0  0.0 - 7.0 %   BASO% 0.6  0.0 - 2.0 %  COMPREHENSIVE METABOLIC PANEL (VV61)     Status: Abnormal   Collection Time    05/12/14  8:06 AM      Result Value Ref Range   Sodium 141  136 - 145 mEq/L   Potassium 4.5  3.5 - 5.1 mEq/L   Chloride 102  98 - 109 mEq/L   CO2 28  22 - 29 mEq/L   Glucose 98  70 - 140 mg/dl  BUN 12.4  7.0 - 26.0  mg/dL   Creatinine 0.8  0.6 - 1.1 mg/dL   Total Bilirubin 0.48  0.20 - 1.20 mg/dL   Alkaline Phosphatase 54  40 - 150 U/L   AST 15  5 - 34 U/L   ALT 15  0 - 55 U/L   Total Protein 7.1  6.4 - 8.3 g/dL   Albumin 3.9  3.5 - 5.0 g/dL   Calcium 9.7  8.4 - 10.4 mg/dL   Anion Gap 12 (*) 3 - 11 mEq/L     RADIOLOGY RESULTS: See E-Chart or I-Site for most recent results.  Images and reports are reviewed.  Mm Digital Diagnostic Unilat L  05/11/2014   CLINICAL DATA:  Patient presented with palpable right breast mass earlier this month, recent diagnosis of right breast cancer, pathology invasive ductal carcinoma, ultrasound core biopsy on 05/03/2014. Updated left mammogram requested prior to pre-treatment MRI.  EXAM: DIGITAL DIAGNOSTIC  LEFT MAMMOGRAM WITH CAD  COMPARISON:  07/21/2013, 07/15/2012, dating back to 03/20/2006.  ACR Breast Density Category c: The breast tissue is heterogeneously dense, which may obscure small masses.  FINDINGS: CC and MLO views of the left breast were obtained. No findings suspicious for malignancy in the left breast.  Mammographic images were processed with CAD.  IMPRESSION: No mammographic evidence of malignancy, left breast.  RECOMMENDATION: Treatment plan for the newly diagnosed right breast cancer.  I have discussed the findings and recommendations with the patient. Results were also provided in writing at the conclusion of the visit. If applicable, a reminder letter will be sent to the patient regarding the next appointment.  BI-RADS CATEGORY  1: Negative.   Electronically Signed   By: Evangeline Dakin M.D.   On: 05/11/2014 09:13   Mm Digital Diagnostic Unilat R  05/04/2014   CLINICAL DATA:  Right breast mass  EXAM: DIAGNOSTIC RIGHT MAMMOGRAM POST ULTRASOUND BIOPSY  COMPARISON:  Previous exams  FINDINGS: Mammographic images were obtained following ultrasound guided biopsy of a mass in the 10 o'clock anterior right breast. Marker clip projects over the mass.  IMPRESSION:  Ribbon shaped marker clip in appropriate position  Final Assessment: Post Procedure Mammograms for Marker Placement   Electronically Signed   By: Skipper Cliche M.D.   On: 05/04/2014 11:23   Mm Digital Diagnostic Unilat R  05/03/2014   CLINICAL DATA:  Palpable right breast mass  EXAM: DIGITAL DIAGNOSTIC  RIGHT MAMMOGRAM WITH CAD  ULTRASOUND RIGHT BREAST  COMPARISON:  All prior studies dating back to 2007  ACR Breast Density Category b: There are scattered areas of fibroglandular density.  FINDINGS: There is a hyper attenuating round mass with lobulated margins anteriorly in the upper outer quadrant of the right breast. There are regional punctate calcifications throughout the anterior right breast, which are not significantly changed when compared to prior studies dating back to 2009.  Mammographic images were processed with CAD.  On physical exam, there is a very firm superficial mass elevating the scan 1 cm from the nipple in the 10 o'clock position of the right breast.  Ultrasound is performed, showing a round mass corresponding to the palpable abnormality. It is homogeneously hypoechoic with increased sound transmission and anti parallel orientation. It shows microlobulated borders and measures 37 x 24 x 24 mm. There appears to be an intraductal/satellite component extending toward the 11 and 12 o'clock position.  Ultrasound of the right axilla reveals normal lymph node showing normal size and normal hila with no cortical thickening.  IMPRESSION: Highly suspicious mass right breast  RECOMMENDATION: The patient is returning for ultrasound-guided core needle biopsy on May 04, 2014 at 10 a.m.  I have discussed the findings and recommendations with the patient. Results were also provided in writing at the conclusion of the visit. If applicable, a reminder letter will be sent to the patient regarding the next appointment.  BI-RADS CATEGORY  5: Highly suggestive of malignancy.   Electronically Signed   By:  Skipper Cliche M.D.   On: 05/03/2014 16:06   US Breast Ltd Uni Right Inc Axilla  05/03/2014   CLINICAL DATA:  Palpable right breast mass  EXAM: DIGITAL DIAGNOSTIC  RIGHT MAMMOGRAM WITH CAD  ULTRASOUND RIGHT BREAST  COMPARISON:  All prior studies dating back to 2007  ACR Breast Density Category b: There are scattered areas of fibroglandular density.  FINDINGS: There is a hyper attenuating round mass with lobulated margins anteriorly in the upper outer quadrant of the right breast. There are regional punctate calcifications throughout the anterior right breast, which are not significantly changed when compared to prior studies dating back to 2009.  Mammographic images were processed with CAD.  On physical exam, there is a very firm superficial mass elevating the scan 1 cm from the nipple in the 10 o'clock position of the right breast.  Ultrasound is performed, showing a round mass corresponding to the palpable abnormality. It is homogeneously hypoechoic with increased sound transmission and anti parallel orientation. It shows microlobulated borders and measures 37 x 24 x 24 mm. There appears to be an intraductal/satellite component extending toward the 11 and 12 o'clock position.  Ultrasound of the right axilla reveals normal lymph node showing normal size and normal hila with no cortical thickening.  IMPRESSION: Highly suspicious mass right breast  RECOMMENDATION: The patient is returning for ultrasound-guided core needle biopsy on May 04, 2014 at 10 a.m.  I have discussed the findings and recommendations with the patient. Results were also provided in writing at the conclusion of the visit. If applicable, a reminder letter will be sent to the patient regarding the next appointment.  BI-RADS CATEGORY  5: Highly suggestive of malignancy.   Electronically Signed   By: Skipper Cliche M.D.   On: 05/03/2014 16:06   [place] Rt Breast Bx W Loc Dev 1st Lesion Img Bx Spec US Guide  05/05/2014   ADDENDUM REPORT: 05/05/2014  15:05  ADDENDUM: Pathologic results have become available and indicate invasive ductal carcinoma. This is concordant with imaging findings. The patient has an appointment scheduled with the multidisciplinary Clinic on May 12, 2014. Given the size of the mass, plus the concern for an intraductal component, MRI is being scheduled for the patient. I gave these results to the patient on May 05, 2014 at 1300 hours. We discussed multi disciplinary clinic and MRI. I answered the patient's questions. She indicated no complaints or complications related to the biopsy.   Electronically Signed   By: Skipper Cliche M.D.   On: 05/05/2014 15:05   05/05/2014   CLINICAL DATA:  Mass periareolar 10 o'clock position right breast  EXAM: ULTRASOUND GUIDED RIGHT BREAST CORE NEEDLE BIOPSY  COMPARISON:  Previous exams.  FINDINGS: I met with the patient and we discussed the procedure of ultrasound-guided biopsy, including benefits and alternatives. We discussed the high likelihood of a successful procedure. We discussed the risks of the procedure, including infection, bleeding, tissue injury, clip migration, and inadequate sampling. Informed written consent was given. The usual time-out protocol was performed immediately  prior to the procedure.  Using sterile technique and 2% Lidocaine as local anesthetic, under direct ultrasound visualization, a 12 gauge spring-loaded device was used to perform biopsy of right breast mass using an inferolateral to superomedial approach. At the conclusion of the procedure a tissue marker clip was deployed into the biopsy cavity. Follow up 2 view mammogram was performed and dictated separately.  IMPRESSION: Ultrasound guided biopsy of right breast mass. No apparent complications.  Electronically Signed: By: Skipper Cliche M.D. On: 05/04/2014 10:56      ASSESSMENT AND PLAN: Breast cancer of upper-outer quadrant of right female breast Pt has a clinical T2N0 left breast cancer.   We will plan to  get an Oncotype on the core needle biopsy.  If this is high, she will need a port for neoadjuvant chemotherapy.  If low, she will get neoadjuvant hormones.  She is less likely to have a good response to hormone treatment alone.  If she does not appear to be responding, we may do surgery after the intermediate imaging.    I reviewed port a cath with the patient in terms of risks and benefits.  I discussed the risk of PTX, bleeding, malfunction, infection with the patient.    She would need mastectomy without any response due to the tumor size/breast size ratio.  She may also benefit from seeing a plastic surgeon for possible reduction mammoplasty on the left and mastopexy on the right.  We will discuss this after seeing what her response is.  She would like to attempt breast conservation if at all possible.  There is no evidence of lymph node involvement.    45 min spent in evaluation, examination, counseling, and coordination of care.  >50% spent in counseling.        Milus Height MD Surgical Oncology, General and [facility]      Visit Diagnoses: 1. Breast cancer of upper-outer quadrant of right female breast     Primary Care Physician:  [name], MD

## 2014-05-12 NOTE — Progress Notes (Signed)
Received oncotype order from Dr. Jana Hakim. Requisition sent to pathology.

## 2014-05-12 NOTE — Progress Notes (Unsigned)
Pt seen by RD during [facility] on 05/12/2014  Diet recall indicated pt consuming minimal intake of red meats, and has been consuming JuicePlus supplements to assist in nutrient intake. Educated pt on plant based diet and increasing plant based protein, which pt was familiar with as she used to follow a vegetarian diet. Pt has been experiencing some throat irritations with certain fruits, recommend to consider buying organic, implementing canned fruits in own juices or preparing smoothies for increased tolerance. Encouraged intake of 5-7 servings/daily.  Reviewed low fat/heart healthy fat diet; encouraged intake of flax seed powder, almonds/walnuts, olive oil/canola oil and intake of Smart Balance or Earth Balance products.  Provided pt education handouts regarding breast cancer nutrition myths, organic vs non-organic foods, and antioxidant/plant-based diets  Provided pt with outpatient oncology RD contact information and encouraged pt to follow up with any additional questions or concerns.  Brandi Abide MS RD LDN Clinical Dietitian [phone]

## 2014-05-12 NOTE — Progress Notes (Signed)
Checked in new patient with no concerns other than her insurance may change. She has appealed with [facility] on her plan. She has new card starting 05/22/14. She has not been out of the country and she has her appt card and [facility] packet. She has been advised of [employer].

## 2014-05-12 NOTE — Progress Notes (Signed)
Radiation Oncology         [id number]) [phone] ________________________________  Initial outpatient Consultation - Date: 05/12/2014   Name: Brandi Chen MRN: [id number]   DOB: 30-May-1961  REFERRING PHYSICIAN: [name], MD  DIAGNOSIS: T2N0 Invasive Ductal Carcinoma of the Right Breast Cancer  HISTORY OF PRESENT ILLNESS::Brandi Chen is a 53 y.o. female who palpated a right breast mass. This measured 3.7 by 2.4 by 2.4 cm on ultrasound. No nodes were seen on ultrasound. A biopsy was performed showing a Grade 2 Invasive Ductal Carcinoma which was ER+PR+HER2- [id number]%. MRI was scheduled but cancelled due to financial concerns. She had menarche at 59 and is GxP0 undergoing menopause 4 years ago with no HRT use. She is sore after her biopsy.   PREVIOUS RADIATION THERAPY: No  PAST MEDICAL HISTORY:  has a past medical history of ADD (attention deficit disorder); Anxiety; and Basal cell cancer.    PAST SURGICAL HISTORY: Past Surgical History  Procedure Laterality Date  . Basal cell excised      FAMILY HISTORY:  Family History  Problem Relation Age of Onset  . Cancer Mother     bladder  . Hypertension Father   . Breast cancer Paternal Aunt 12  . Cancer Maternal Grandmother     ovarian/uterine    SOCIAL HISTORY:  History  Substance Use Topics  . Smoking status: Never Smoker   . Smokeless tobacco: Never Used  . Alcohol Use: Yes     Comment: social    ALLERGIES: Review of patient's allergies indicates no known allergies.  MEDICATIONS:  Current Outpatient Prescriptions  Medication Sig Dispense Refill  . ALPRAZolam (XANAX PO) Take by mouth.      Marland Kitchen amphetamine-dextroamphetamine (ADDERALL) 10 MG tablet Take 20 mg by mouth 2 (two) times daily.        . clonazePAM (KLONOPIN) 0.5 MG tablet Take 0.5 mg by mouth 2 (two) times daily as needed for anxiety.      . divalproex (DEPAKOTE) 250 MG DR tablet Take 250 mg by mouth 3 (three) times daily.      Marland Kitchen glucosamine-chondroitin 500-400  MG tablet Take 1 tablet by mouth 3 (three) times daily.         No current facility-administered medications for this encounter.    REVIEW OF SYSTEMS:  A 15 point review of systems is documented in the electronic medical record. This was obtained by the nursing staff. However, I reviewed this with the patient to discuss relevant findings and make appropriate changes.  Pertinent items are noted in HPI.  PHYSICAL EXAM: There were no vitals filed for this visit.. . She has a palpable mass in the lower outer quadrant.  This is associated with some bruising and a hematoma. It is palpable just below the skin with no skin involvement.   LABORATORY DATA:  Lab Results  Component Value Date   WBC 5.5 05/12/2014   HGB 13.5 05/12/2014   HCT 40.8 05/12/2014   MCV 95.0 05/12/2014   PLT 220 05/12/2014   Lab Results  Component Value Date   NA 141 05/12/2014   K 4.5 05/12/2014   CO2 28 05/12/2014   Lab Results  Component Value Date   ALT 15 05/12/2014   AST 15 05/12/2014   ALKPHOS 54 05/12/2014   BILITOT 0.48 05/12/2014     RADIOGRAPHY: Mm Digital Diagnostic Unilat L  05/11/2014   CLINICAL DATA:  Patient presented with palpable right breast mass earlier this month, recent diagnosis of right breast  cancer, pathology invasive ductal carcinoma, ultrasound core biopsy on 05/03/2014. Updated left mammogram requested prior to pre-treatment MRI.  EXAM: DIGITAL DIAGNOSTIC  LEFT MAMMOGRAM WITH CAD  COMPARISON:  07/21/2013, 07/15/2012, dating back to 03/20/2006.  ACR Breast Density Category c: The breast tissue is heterogeneously dense, which may obscure small masses.  FINDINGS: CC and MLO views of the left breast were obtained. No findings suspicious for malignancy in the left breast.  Mammographic images were processed with CAD.  IMPRESSION: No mammographic evidence of malignancy, left breast.  RECOMMENDATION: Treatment plan for the newly diagnosed right breast cancer.  I have discussed the findings and recommendations  with the patient. Results were also provided in writing at the conclusion of the visit. If applicable, a reminder letter will be sent to the patient regarding the next appointment.  BI-RADS CATEGORY  1: Negative.   Electronically Signed   By: Evangeline Dakin M.D.   On: 05/11/2014 09:13   Mm Digital Diagnostic Unilat R  05/04/2014   CLINICAL DATA:  Right breast mass  EXAM: DIAGNOSTIC RIGHT MAMMOGRAM POST ULTRASOUND BIOPSY  COMPARISON:  Previous exams  FINDINGS: Mammographic images were obtained following ultrasound guided biopsy of a mass in the 10 o'clock anterior right breast. Marker clip projects over the mass.  IMPRESSION: Ribbon shaped marker clip in appropriate position  Final Assessment: Post Procedure Mammograms for Marker Placement   Electronically Signed   By: Skipper Cliche M.D.   On: 05/04/2014 11:23   Mm Digital Diagnostic Unilat R  05/03/2014   CLINICAL DATA:  Palpable right breast mass  EXAM: DIGITAL DIAGNOSTIC  RIGHT MAMMOGRAM WITH CAD  ULTRASOUND RIGHT BREAST  COMPARISON:  All prior studies dating back to 2007  ACR Breast Density Category b: There are scattered areas of fibroglandular density.  FINDINGS: There is a hyper attenuating round mass with lobulated margins anteriorly in the upper outer quadrant of the right breast. There are regional punctate calcifications throughout the anterior right breast, which are not significantly changed when compared to prior studies dating back to 2009.  Mammographic images were processed with CAD.  On physical exam, there is a very firm superficial mass elevating the scan 1 cm from the nipple in the 10 o'clock position of the right breast.  Ultrasound is performed, showing a round mass corresponding to the palpable abnormality. It is homogeneously hypoechoic with increased sound transmission and anti parallel orientation. It shows microlobulated borders and measures 37 x 24 x 24 mm. There appears to be an intraductal/satellite component extending toward  the 11 and 12 o'clock position.  Ultrasound of the right axilla reveals normal lymph node showing normal size and normal hila with no cortical thickening.  IMPRESSION: Highly suspicious mass right breast  RECOMMENDATION: The patient is returning for ultrasound-guided core needle biopsy on May 04, 2014 at 10 a.m.  I have discussed the findings and recommendations with the patient. Results were also provided in writing at the conclusion of the visit. If applicable, a reminder letter will be sent to the patient regarding the next appointment.  BI-RADS CATEGORY  5: Highly suggestive of malignancy.   Electronically Signed   By: Skipper Cliche M.D.   On: 05/03/2014 16:06   US Breast Ltd Uni Right Inc Axilla  05/03/2014   CLINICAL DATA:  Palpable right breast mass  EXAM: DIGITAL DIAGNOSTIC  RIGHT MAMMOGRAM WITH CAD  ULTRASOUND RIGHT BREAST  COMPARISON:  All prior studies dating back to 2007  ACR Breast Density Category b: There are scattered  areas of fibroglandular density.  FINDINGS: There is a hyper attenuating round mass with lobulated margins anteriorly in the upper outer quadrant of the right breast. There are regional punctate calcifications throughout the anterior right breast, which are not significantly changed when compared to prior studies dating back to 2009.  Mammographic images were processed with CAD.  On physical exam, there is a very firm superficial mass elevating the scan 1 cm from the nipple in the 10 o'clock position of the right breast.  Ultrasound is performed, showing a round mass corresponding to the palpable abnormality. It is homogeneously hypoechoic with increased sound transmission and anti parallel orientation. It shows microlobulated borders and measures 37 x 24 x 24 mm. There appears to be an intraductal/satellite component extending toward the 11 and 12 o'clock position.  Ultrasound of the right axilla reveals normal lymph node showing normal size and normal hila with no cortical  thickening.  IMPRESSION: Highly suspicious mass right breast  RECOMMENDATION: The patient is returning for ultrasound-guided core needle biopsy on May 04, 2014 at 10 a.m.  I have discussed the findings and recommendations with the patient. Results were also provided in writing at the conclusion of the visit. If applicable, a reminder letter will be sent to the patient regarding the next appointment.  BI-RADS CATEGORY  5: Highly suggestive of malignancy.   Electronically Signed   By: Skipper Cliche M.D.   On: 05/03/2014 16:06   [place] Rt Breast Bx W Loc Dev 1st Lesion Img Bx Spec US Guide  05/05/2014   ADDENDUM REPORT: 05/05/2014 15:05  ADDENDUM: Pathologic results have become available and indicate invasive ductal carcinoma. This is concordant with imaging findings. The patient has an appointment scheduled with the multidisciplinary Clinic on May 12, 2014. Given the size of the mass, plus the concern for an intraductal component, MRI is being scheduled for the patient. I gave these results to the patient on May 05, 2014 at 1300 hours. We discussed multi disciplinary clinic and MRI. I answered the patient's questions. She indicated no complaints or complications related to the biopsy.   Electronically Signed   By: Skipper Cliche M.D.   On: 05/05/2014 15:05   05/05/2014   CLINICAL DATA:  Mass periareolar 10 o'clock position right breast  EXAM: ULTRASOUND GUIDED RIGHT BREAST CORE NEEDLE BIOPSY  COMPARISON:  Previous exams.  FINDINGS: I met with the patient and we discussed the procedure of ultrasound-guided biopsy, including benefits and alternatives. We discussed the high likelihood of a successful procedure. We discussed the risks of the procedure, including infection, bleeding, tissue injury, clip migration, and inadequate sampling. Informed written consent was given. The usual time-out protocol was performed immediately prior to the procedure.  Using sterile technique and 2% Lidocaine as local anesthetic,  under direct ultrasound visualization, a 12 gauge spring-loaded device was used to perform biopsy of right breast mass using an inferolateral to superomedial approach. At the conclusion of the procedure a tissue marker clip was deployed into the biopsy cavity. Follow up 2 view mammogram was performed and dictated separately.  IMPRESSION: Ultrasound guided biopsy of right breast mass. No apparent complications.  Electronically Signed: By: Skipper Cliche M.D. On: 05/04/2014 10:56      IMPRESSION: T2N0 Invasive Ductal Carcinoma of the Right breast  PLAN: I spoke to the patient today regarding her diagnosis and options for treatment. We discussed the equivalence in terms of survival and local failure between mastectomy and breast conservation. She is interested in breast conservation and  as such, we will order an oncotype on her surgical biopsy to see if she requires chemotherapy. If it is high, she will proceed on with neoadjuvant chemotherapy with the goal of lumpectomy.  If it is low, an attempt at neoadjuvant hormonal therapy for downstaging will be tried. If she does not respond, she will likely require a mastectomy given her tumor to breast ratio.    If she does undergo lumpectomy, she will require adjuvant radiation. We discussed the role of radiation in decreasing local failures in patients who undergo lumpectomy. We discussed the process of simulation and the placement tattoos. We discussed 4-6 weeks of treatment as an outpatient. We discussed the possibility of asymptomatic lung damage. We discussed the low likelihood of secondary malignancies. We discussed the possible side effects including but not limited to skin redness, fatigue, permanent skin darkening, and breast swelling.    If she undergoes mastectomy, at this point she would not require radiation unless she is found surgically to have positive lymph nodes or a tumor over 5 cm.   She met with medical oncology as well as a member of our  patient family support team and our physical therapist. I will plan on seeing her back after her surgery.  I spent 40 minutes face to face with the patient and more than 50% of that time was spent in counseling and/or coordination of care.   ------------------------------------------------  Thea Silversmith, MD

## 2014-05-13 ENCOUNTER — Encounter: Payer: Self-pay | Admitting: *Deleted

## 2014-05-13 ENCOUNTER — Telehealth: Payer: Self-pay | Admitting: Oncology

## 2014-05-13 NOTE — Progress Notes (Unsigned)
Faxed care plan to Dr. Harrington Challenger at [phone].

## 2014-05-13 NOTE — Telephone Encounter (Signed)
cld & spoke to pt to adv of appt-gave pt time & date of appt-pt understood

## 2014-05-14 ENCOUNTER — Telehealth: Payer: Self-pay

## 2014-05-14 NOTE — Telephone Encounter (Signed)
Patient called me about her breast MRI.  She received the letter from the ins co saying it was authorized.  She thought that meant that they would pay 100% for it.  I explained that the prior auth had nothing to do with her ins benefits and her financial responsibility. She said that she could not afford the $1200 [facility] required her to pay in advance and oncologist had told her the MRI not really necessary and could cancel.  I advised her to speak with them and let them cancel it.

## 2014-05-16 ENCOUNTER — Other Ambulatory Visit: Payer: 59

## 2014-05-18 ENCOUNTER — Telehealth: Payer: Self-pay | Admitting: *Deleted

## 2014-05-18 NOTE — Telephone Encounter (Signed)
Left vm for pt to return call regarding [place] from 05/12/14

## 2014-05-18 NOTE — Telephone Encounter (Signed)
Spoke to pt concerning [place] from 05/12/14. Pt denies questions or concerns regarding dx or treatment care plan. Pt relate she is waiting on results from Oncotype test to see the route of her treatment plan. Pt relate she is having difficulty with insurance company. Will reach out to our [facility] to get in touch with her concerning her financial stress. Encourage pt to call with further needs. Received verbal understanding. Contact information given.

## 2014-05-19 ENCOUNTER — Encounter: Payer: Self-pay | Admitting: Oncology

## 2014-05-19 NOTE — Progress Notes (Signed)
See prev notes. Dawn called to say the patient had questions about insurance. I have already talked to the patient. She has advised of info insurance was requesting. I advised her to make sure she checked with them on what they were needing.

## 2014-05-25 ENCOUNTER — Telehealth: Payer: Self-pay | Admitting: *Deleted

## 2014-05-25 NOTE — Telephone Encounter (Signed)
Called pt with Oncotype results of 19. Informed pt Dr. Jana Hakim will discuss in detail the results and if chemo is recommended. Received verbal understanding. Pt in good spirits. Denies further needs at this time. Encourage pt to call with questions. Contact information given.

## 2014-05-28 ENCOUNTER — Ambulatory Visit (HOSPITAL_BASED_OUTPATIENT_CLINIC_OR_DEPARTMENT_OTHER): Payer: 59 | Admitting: Oncology

## 2014-05-28 VITALS — BP 134/81 | HR 83 | Temp 98.6°F | Resp 18 | Ht 65.0 in | Wt 156.5 lb

## 2014-05-28 DIAGNOSIS — Z170 Estrogen receptor positive status [ER+]: Secondary | ICD-10-CM

## 2014-05-28 DIAGNOSIS — C50419 Malignant neoplasm of upper-outer quadrant of unspecified female breast: Secondary | ICD-10-CM

## 2014-05-28 DIAGNOSIS — C50411 Malignant neoplasm of upper-outer quadrant of right female breast: Secondary | ICD-10-CM

## 2014-05-28 MED ORDER — TAMOXIFEN CITRATE 20 MG PO TABS: 20.0000 mg | ORAL_TABLET | Freq: Every day | ORAL | Status: DC

## 2014-05-28 NOTE — Progress Notes (Signed)
[place]  [phone] [phone]) [phone]     ID: Brandi Chen DOB: [birth year]  MR#: [id number]  TUU#:[id number]  Patient Care Team: [name], MD as PCP - General (Family Medicine) [name], MD as Consulting Physician (General Surgery) Chauncey Cruel, MD as Consulting Physician (Oncology) Thea Silversmith, MD as Consulting Physician (Radiation Oncology) [name], MD as Consulting Physician (Gynecology)  CHIEF COMPLAINT: Newly diagnosed breast cancer  CURRENT TREATMENT: To start neoadjuvant therapy   BREAST CANCER HISTORY: Brandi Chen herself noted a change in her right breast while putting on a bathing suit. She immediately contacted Dr. Phineas Real, who works here in and confirmed a mass in the upper outer quadrant of the right breast, which she was unable to aspirate. He set the patient up for mammography at the breast Center 05/03/2014, and this confirmed a mass with lobulated margins in the upper outer quadrant which was easily palpable and which by ultrasound was hypoechoic and microlobulated, measuring 3.7 cm. The right axilla was unremarkable.  Biopsy of this mass 05/04/2014 showed ([id number]) and invasive ductal carcinoma, grade 2, estrogen and progesterone receptor positive, with an MIB-1 of 15%, and no HER-2 amplification, the signals ratio being 0.94 and the number per cell 1.70.  The patient's subsequent history is as detailed below.  INTERVAL HISTORY: Brandi Chen returns to the breast clinic today to discuss neoadjuvant systemic therapy, accompanied by her sister Brandi Chen and the patient's aunt from [place], [place]. Since her last visit here we obtained the patient Oncotype report, which shows a score of 19, predicting a risk of recurrence outside the breast of 12% if the patient's only systemic treatment is tamoxifen for 5 years. This falls in the "intermediate risk" category.  REVIEW OF SYSTEMS: The only new symptom Brandi Chen reports is  pain in the right breast when she lies on it. A detailed review of systems today was otherwise stable.  PAST MEDICAL HISTORY: Past Medical History  Diagnosis Date  . ADD (attention deficit disorder)   . Anxiety   . Basal cell cancer   . Breast cancer   . Depression     PAST SURGICAL HISTORY: Past Surgical History  Procedure Laterality Date  . Basal cell excised      FAMILY HISTORY Family History  Problem Relation Age of Onset  . Cancer Mother     bladder  . Hypertension Father   . Breast cancer Paternal Aunt 67  . Cancer Maternal Grandmother     ovarian/uterine   the patient's father died at the age of 57 either from a myocardial infarction or a stroke. The patient's mother died at the age of 47 from metastatic bladder cancer. She was a heavy smoker. The patient has one brother and one sister. There is no other cancer in the immediate family, although on the mother's side 1 and was diagnosed with what may have been cervical or ovarian cancer late in life, and on the father's side there was an aunt with breast cancer diagnosed in her late 60s  GYNECOLOGIC HISTORY:  Patient's last menstrual period was 08/27/2008. Menarche age 9, the patient is GX P0. She stopped having periods in 2009. She did not take hormone replacement. She did take Depo-Provera for many years because of heavy periods remotely.  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 She is starting her on business which includes dog sitting, painting, doing grocery shopping, and generally  being a "girl Friday". She is divorced and lives with a roommate Brandi Chen who is disabled secondary to fibromyalgia. The patient's husband Brandi Chen, who is a survivor of colon cancer, has his own locksmith business. The patient's sister Brandi Chen is a stay-at-home mom here in [place]. The patient's brother lives in [place] where he works in conservation. The patient attends a [employer]    ADVANCED DIRECTIVES: Not in place. At the time of the patient's 05/12/2014 visit she was given the patient was given the appropriate documents to complete and notarize at her discretion   HEALTH MAINTENANCE: History  Substance Use Topics  . Smoking status: Never Smoker   . Smokeless tobacco: Never Used  . Alcohol Use: 4.2 oz/week    7 Glasses of wine per week     Comment: social     Colonoscopy:  PAP:  Bone density:  Lipid panel:  No Known Allergies  Current Outpatient Prescriptions  Medication Sig Dispense Refill  . ALPRAZolam (XANAX PO) Take by mouth.      Marland Kitchen amphetamine-dextroamphetamine (ADDERALL) 10 MG tablet Take 20 mg by mouth 2 (two) times daily.        . clonazePAM (KLONOPIN) 0.5 MG tablet Take 0.5 mg by mouth 2 (two) times daily as needed for anxiety.      . divalproex (DEPAKOTE) 250 MG DR tablet Take 250 mg by mouth 3 (three) times daily.      Marland Kitchen glucosamine-chondroitin 500-400 MG tablet Take 1 tablet by mouth 3 (three) times daily.         No current facility-administered medications for this visit.    OBJECTIVE: Middle-aged white woman in no acute distress Filed Vitals:   05/28/14 1519  BP: 134/81  Pulse: 83  Temp: 98.6 F (37 C)  Resp: 18     Body mass index is 26.04 kg/(m^2).    ECOG FS:1 - Symptomatic but completely ambulatory  Ocular: Sclerae unicteric, EOMs intact Ear-nose-throat: Oropharynx clear, no thrush or other lesions Lymphatic: No cervical or supraclavicular adenopathy Lungs no rales or rhonchi Heart regular rate and rhythm Abd soft, nontender, positive bowel sounds MSK no focal spinal tenderness, no joint edema Neuro: non-focal, well-oriented, anxious affect Breasts: Exam of the right breast is essentially unchanged from the last visit. There is a movable mass measuring 3-4 cm in the lower outer quadrant, associated with a mild ecchymosis. There is no swelling, nipple change, or axillary adenopathy noted. The left breast  is unremarkable   LAB RESULTS:  CMP     Component Value Date/Time   NA 141 05/12/2014 0806   K 4.5 05/12/2014 0806   CO2 28 05/12/2014 0806   GLUCOSE 98 05/12/2014 0806   BUN 12.4 05/12/2014 0806   CREATININE 0.8 05/12/2014 0806   CALCIUM 9.7 05/12/2014 0806   PROT 7.1 05/12/2014 0806   ALBUMIN 3.9 05/12/2014 0806   AST 15 05/12/2014 0806   ALT 15 05/12/2014 0806   ALKPHOS 54 05/12/2014 0806   BILITOT 0.48 05/12/2014 0806    I No results found for this basename: SPEP,  UPEP,   kappa and lambda light chains    Lab Results  Component Value Date   WBC 5.5 05/12/2014   NEUTROABS 3.2 05/12/2014   HGB 13.5 05/12/2014   HCT 40.8 05/12/2014   MCV 95.0 05/12/2014   PLT 220 05/12/2014      Chemistry      Component Value Date/Time   NA 141 05/12/2014 0806   K 4.5  05/12/2014 0806   CO2 28 05/12/2014 0806   BUN 12.4 05/12/2014 0806   CREATININE 0.8 05/12/2014 0806      Component Value Date/Time   CALCIUM 9.7 05/12/2014 0806   ALKPHOS 54 05/12/2014 0806   AST 15 05/12/2014 0806   ALT 15 05/12/2014 0806   BILITOT 0.48 05/12/2014 0806       No results found for this basename: LABCA2    No components found with this basename: LABCA125    No results found for this basename: INR,  in the last 168 hours  Urinalysis    Component Value Date/Time   COLORURINE YELLOW 08/06/2013 [place] 08/06/2013 0852   LABSPEC 1.011 08/06/2013 0852   PHURINE 6.0 08/06/2013 0852   GLUCOSEU NEG 08/06/2013 0852   HGBUR NEG 08/06/2013 0852   BILIRUBINUR NEG 08/06/2013 0852   KETONESUR NEG 08/06/2013 0852   PROTEINUR NEG 08/06/2013 0852   UROBILINOGEN 0.2 08/06/2013 0852   NITRITE NEG 08/06/2013 0852   LEUKOCYTESUR NEG 08/06/2013 0852    STUDIES: Mm Digital Diagnostic Unilat L  05/11/2014   CLINICAL DATA:  Patient presented with palpable right breast mass earlier this month, recent diagnosis of right breast cancer, pathology invasive ductal carcinoma, ultrasound core biopsy on 05/03/2014.  Updated left mammogram requested prior to pre-treatment MRI.  EXAM: DIGITAL DIAGNOSTIC  LEFT MAMMOGRAM WITH CAD  COMPARISON:  07/21/2013, 07/15/2012, dating back to 03/20/2006.  ACR Breast Density Category c: The breast tissue is heterogeneously dense, which may obscure small masses.  FINDINGS: CC and MLO views of the left breast were obtained. No findings suspicious for malignancy in the left breast.  Mammographic images were processed with CAD.  IMPRESSION: No mammographic evidence of malignancy, left breast.  RECOMMENDATION: Treatment plan for the newly diagnosed right breast cancer.  I have discussed the findings and recommendations with the patient. Results were also provided in writing at the conclusion of the visit. If applicable, a reminder letter will be sent to the patient regarding the next appointment.  BI-RADS CATEGORY  1: Negative.   Electronically Signed   By: Evangeline Dakin M.D.   On: 05/11/2014 09:13   Mm Digital Diagnostic Unilat R  05/04/2014   CLINICAL DATA:  Right breast mass  EXAM: DIAGNOSTIC RIGHT MAMMOGRAM POST ULTRASOUND BIOPSY  COMPARISON:  Previous exams  FINDINGS: Mammographic images were obtained following ultrasound guided biopsy of a mass in the 10 o'clock anterior right breast. Marker clip projects over the mass.  IMPRESSION: Ribbon shaped marker clip in appropriate position  Final Assessment: Post Procedure Mammograms for Marker Placement   Electronically Signed   By: Skipper Cliche M.D.   On: 05/04/2014 11:23   Mm Digital Diagnostic Unilat R  05/03/2014   CLINICAL DATA:  Palpable right breast mass  EXAM: DIGITAL DIAGNOSTIC  RIGHT MAMMOGRAM WITH CAD  ULTRASOUND RIGHT BREAST  COMPARISON:  All prior studies dating back to 2007  ACR Breast Density Category b: There are scattered areas of fibroglandular density.  FINDINGS: There is a hyper attenuating round mass with lobulated margins anteriorly in the upper outer quadrant of the right breast. There are regional punctate  calcifications throughout the anterior right breast, which are not significantly changed when compared to prior studies dating back to 2009.  Mammographic images were processed with CAD.  On physical exam, there is a very firm superficial mass elevating the scan 1 cm from the nipple in the 10 o'clock position of the right breast.  Ultrasound is performed,  showing a round mass corresponding to the palpable abnormality. It is homogeneously hypoechoic with increased sound transmission and anti parallel orientation. It shows microlobulated borders and measures 37 x 24 x 24 mm. There appears to be an intraductal/satellite component extending toward the 11 and 12 o'clock position.  Ultrasound of the right axilla reveals normal lymph node showing normal size and normal hila with no cortical thickening.  IMPRESSION: Highly suspicious mass right breast  RECOMMENDATION: The patient is returning for ultrasound-guided core needle biopsy on May 04, 2014 at 10 a.m.  I have discussed the findings and recommendations with the patient. Results were also provided in writing at the conclusion of the visit. If applicable, a reminder letter will be sent to the patient regarding the next appointment.  BI-RADS CATEGORY  5: Highly suggestive of malignancy.   Electronically Signed   By: Skipper Cliche M.D.   On: 05/03/2014 16:06   US Breast Ltd Uni Right Inc Axilla  05/03/2014   CLINICAL DATA:  Palpable right breast mass  EXAM: DIGITAL DIAGNOSTIC  RIGHT MAMMOGRAM WITH CAD  ULTRASOUND RIGHT BREAST  COMPARISON:  All prior studies dating back to 2007  ACR Breast Density Category b: There are scattered areas of fibroglandular density.  FINDINGS: There is a hyper attenuating round mass with lobulated margins anteriorly in the upper outer quadrant of the right breast. There are regional punctate calcifications throughout the anterior right breast, which are not significantly changed when compared to prior studies dating back to 2009.   Mammographic images were processed with CAD.  On physical exam, there is a very firm superficial mass elevating the scan 1 cm from the nipple in the 10 o'clock position of the right breast.  Ultrasound is performed, showing a round mass corresponding to the palpable abnormality. It is homogeneously hypoechoic with increased sound transmission and anti parallel orientation. It shows microlobulated borders and measures 37 x 24 x 24 mm. There appears to be an intraductal/satellite component extending toward the 11 and 12 o'clock position.  Ultrasound of the right axilla reveals normal lymph node showing normal size and normal hila with no cortical thickening.  IMPRESSION: Highly suspicious mass right breast  RECOMMENDATION: The patient is returning for ultrasound-guided core needle biopsy on May 04, 2014 at 10 a.m.  I have discussed the findings and recommendations with the patient. Results were also provided in writing at the conclusion of the visit. If applicable, a reminder letter will be sent to the patient regarding the next appointment.  BI-RADS CATEGORY  5: Highly suggestive of malignancy.   Electronically Signed   By: Skipper Cliche M.D.   On: 05/03/2014 16:06   [place] Rt Breast Bx W Loc Dev 1st Lesion Img Bx Spec US Guide  05/05/2014   ADDENDUM REPORT: 05/05/2014 15:05  ADDENDUM: Pathologic results have become available and indicate invasive ductal carcinoma. This is concordant with imaging findings. The patient has an appointment scheduled with the multidisciplinary Clinic on May 12, 2014. Given the size of the mass, plus the concern for an intraductal component, MRI is being scheduled for the patient. I gave these results to the patient on May 05, 2014 at 1300 hours. We discussed multi disciplinary clinic and MRI. I answered the patient's questions. She indicated no complaints or complications related to the biopsy.   Electronically Signed   By: Skipper Cliche M.D.   On: 05/05/2014 15:05   05/05/2014    CLINICAL DATA:  Mass periareolar 10 o'clock position right breast  EXAM:  ULTRASOUND GUIDED RIGHT BREAST CORE NEEDLE BIOPSY  COMPARISON:  Previous exams.  FINDINGS: I met with the patient and we discussed the procedure of ultrasound-guided biopsy, including benefits and alternatives. We discussed the high likelihood of a successful procedure. We discussed the risks of the procedure, including infection, bleeding, tissue injury, clip migration, and inadequate sampling. Informed written consent was given. The usual time-out protocol was performed immediately prior to the procedure.  Using sterile technique and 2% Lidocaine as local anesthetic, under direct ultrasound visualization, a 12 gauge spring-loaded device was used to perform biopsy of right breast mass using an inferolateral to superomedial approach. At the conclusion of the procedure a tissue marker clip was deployed into the biopsy cavity. Follow up 2 view mammogram was performed and dictated separately.  IMPRESSION: Ultrasound guided biopsy of right breast mass. No apparent complications.  Electronically Signed: By: Skipper Cliche M.D. On: 05/04/2014 10:56    ASSESSMENT: 53 y.o. [place] woman status post right upper outer quadrant breast biopsy 05/04/2014 for a clinical T2 N0, stage IIA invasive ductal carcinoma, grade 2, estrogen and progesterone receptor positive, HER-2 not amplified, with an MIB-1 of 15%  (1) Oncotype score of 19 predicts and out of the breast recurrence within 10 years of 12% of the patient's only systemic therapy is tamoxifen for 5 years. It also predicts additional 3-4% risk reduction with CAF chemotherapy  PLAN: We spent approximately 50 minutes going over Kim's situation. In general, we do not recommend chemotherapy unless the risk reduction is at least 5%. Of course this does not mean that we recommend against chemotherapy. We simply do not make a positive recommendation, and this is in line with NCCN guidelines, which  in these cases only states "endocrine treatment, consider chemotherapy."  We explained that if we treated at 74 women like her with chemotherapy, for would greatly benefit, since they would not develop incurable metastatic disease. 96 however would not benefit, since 68 others were already "cured", with tamoxifen, an additional 8 had the cancer come back despite chemotherapy.   This makes it for a difficult decision, and Brandi Chen could not decide today, but when pressed what she would decide if she had to decide now, she was strongly leaning against chemotherapy. Accordingly we went ahead and placed a prescription for tamoxifen for her to start if and when she makes a decision against chemotherapy. On the other hand I also made her an appointment for our "chemotherapy school" to get more information on the possible side effects, toxicities and complications of treatment, which in her case would consist of cyclophosphamide and docetaxel every 3 weeks x4.  Brandi Chen will call us with their final decision. I am making her a return appointment in approximately 2 months, but of course if she opts for chemotherapy she will need a port and to be seen sooner to get her treatment started    Chauncey Cruel, MD   05/28/2014 3:47 PM

## 2014-05-31 ENCOUNTER — Telehealth: Payer: Self-pay | Admitting: *Deleted

## 2014-05-31 ENCOUNTER — Other Ambulatory Visit: Payer: Self-pay | Admitting: *Deleted

## 2014-05-31 DIAGNOSIS — C50411 Malignant neoplasm of upper-outer quadrant of right female breast: Secondary | ICD-10-CM

## 2014-05-31 MED ORDER — TAMOXIFEN CITRATE 20 MG PO TABS: 20.0000 mg | ORAL_TABLET | Freq: Every day | ORAL | Status: DC

## 2014-05-31 NOTE — Telephone Encounter (Signed)
Tell her that Dr. Jana Hakim is one of the most competent oncologist whose expertise is breast cancer and would trust any recommendation he would make as to her management.

## 2014-05-31 NOTE — Telephone Encounter (Signed)
(  Dr.Fontaine patient) pt has stage 2 breast cancer and is having a difficult time deciding if she should proceed with chemotherapy vs.taking pills. Had appointment with Dr.Marginat on 05/28/14 she asked if you could call her to speak with her, I told pt Dr.Fontaine is out of the office all this week. Pt call back # [phone] she would really like to figure out what direction to proceed with. Please advise

## 2014-06-01 ENCOUNTER — Other Ambulatory Visit: Payer: 59

## 2014-06-01 ENCOUNTER — Telehealth: Payer: Self-pay | Admitting: Oncology

## 2014-06-01 ENCOUNTER — Encounter: Payer: Self-pay | Admitting: *Deleted

## 2014-06-01 NOTE — Telephone Encounter (Signed)
I agree with Dr Toney Rakes that Dr Jana Hakim is a great oncologist and I would defer to his recommendations.  He knows the latest information and statistics as far as breast cancer is concerned.

## 2014-06-01 NOTE — Telephone Encounter (Signed)
Pt informed with the below and asked if I could send message to Dr.Fontaine. I explained to pt that Dr.Fontaine will be out of the office until Monday 06/07/14. But pt asked if I could still route to him. Pt said that Dr.Magrinat really did'nt give her a recommendation on what she should do as far as chemotherapy vs taking medication. Pt main concern is based on her [employer] health recurrence score which is 19. If she should proceed with chemotherapy and lose hair etc. Pt will be attending [facility], but really like to speak with you personally once back in office. I did tell pt I could not promise it would be Monday, But I will send message. Please advise

## 2014-06-01 NOTE — Telephone Encounter (Signed)
, °

## 2014-06-02 ENCOUNTER — Telehealth: Payer: Self-pay | Admitting: *Deleted

## 2014-06-02 NOTE — Telephone Encounter (Signed)
Patient left message that she would like a second opinion to help her decide between tamoxifen and chemo.  She wants [place] to make sure her insurance will pay for it and she would like a call back today. Will ask Iris Pert RN/navigator to speak with her at [phone].

## 2014-06-03 ENCOUNTER — Encounter: Payer: Self-pay | Admitting: *Deleted

## 2014-06-03 NOTE — [facility] Oncology Navigator Note (Signed)
Ms. Pickerill called yesterday requesting a second opinion about whether to take tamoxifen or chemotherapy.  I discussed with her and checked with financial advocate per her request to see if her insurance would cover a second opinion.  I followed up with her today and she has decided to begin taking the tamoxifen and not pursue another opinion.  I encouraged her to call for any other questions or concerns.

## 2014-06-04 ENCOUNTER — Telehealth: Payer: Self-pay | Admitting: Oncology

## 2014-06-04 NOTE — Telephone Encounter (Signed)
, °

## 2014-06-04 NOTE — Telephone Encounter (Signed)
Pt informed with the below note. She received recall letter in mail to schedule annual in Oct.pt asked if she should schedule. I did tell patient she will still need annual, but I would double check.

## 2014-06-06 ENCOUNTER — Other Ambulatory Visit: Payer: Self-pay | Admitting: Oncology

## 2014-06-06 NOTE — Telephone Encounter (Signed)
Yes for annual, but can be anytime this fall.  She can wait until the breast cancer plan is set.

## 2014-06-08 NOTE — Telephone Encounter (Signed)
PT INFORMED WITH THE BELOW NOTE

## 2014-06-18 ENCOUNTER — Telehealth: Payer: Self-pay | Admitting: *Deleted

## 2014-06-18 NOTE — Telephone Encounter (Signed)
Left vm for pt to return call to see is she has had her 2nd opinion on  Chemo vs Tamoxifen. Contact information given.

## 2014-06-21 ENCOUNTER — Encounter: Payer: Self-pay | Admitting: Oncology

## 2014-06-21 NOTE — Progress Notes (Signed)
Patient called and left a message. I called her back and left a message for her on her vmail.

## 2014-06-22 ENCOUNTER — Telehealth: Payer: Self-pay | Admitting: *Deleted

## 2014-06-22 ENCOUNTER — Encounter: Payer: Self-pay | Admitting: *Deleted

## 2014-06-22 NOTE — Progress Notes (Signed)
Pt called to update medication records- reviewed and corrected.  Per phone call - Lamonica states she started the tamoxifen " but not having the hot flashes but seem to be getting nausea late in the day "  Per discussion Silver states she takes her tamoxifen in the morning " with my other pills "  Plan is for Kamora to take the tamoxifen at supper - and monitor if beneficial.  No other needs at this time.

## 2014-06-30 ENCOUNTER — Encounter (HOSPITAL_COMMUNITY): Payer: Self-pay

## 2014-07-04 ENCOUNTER — Other Ambulatory Visit: Payer: Self-pay | Admitting: Oncology

## 2014-07-06 ENCOUNTER — Telehealth: Payer: Self-pay | Admitting: Emergency Medicine

## 2014-07-06 ENCOUNTER — Telehealth: Payer: Self-pay | Admitting: *Deleted

## 2014-07-06 NOTE — Telephone Encounter (Signed)
Left vm for pt to return call regarding flu shot.

## 2014-07-06 NOTE — Telephone Encounter (Signed)
Patient called with questions regarding the flu vaccine. States she "doesn't believe in it". 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 Patient is scheduled to see Dr Jana Hakim on 10/05; in which I advised her she can discuss these questions and concerns with him then.

## 2014-07-13 ENCOUNTER — Encounter: Payer: Self-pay | Admitting: Oncology

## 2014-07-13 NOTE — Progress Notes (Signed)
Called and left message for patient to call me back. She left message about her insurance???

## 2014-07-26 ENCOUNTER — Ambulatory Visit (HOSPITAL_BASED_OUTPATIENT_CLINIC_OR_DEPARTMENT_OTHER): Payer: 59 | Admitting: Oncology

## 2014-07-26 ENCOUNTER — Telehealth: Payer: Self-pay | Admitting: Oncology

## 2014-07-26 VITALS — BP 124/81 | HR 81 | Temp 98.3°F | Resp 18 | Ht 65.0 in | Wt 157.9 lb

## 2014-07-26 DIAGNOSIS — Z170 Estrogen receptor positive status [ER+]: Secondary | ICD-10-CM

## 2014-07-26 DIAGNOSIS — C50411 Malignant neoplasm of upper-outer quadrant of right female breast: Secondary | ICD-10-CM

## 2014-07-26 NOTE — Progress Notes (Signed)
[place]  [phone] [phone]) [phone]     ID: [name] DOB: 1961/02/19  MR#: [id number]  AQT#:[id number]  Patient Care Team: [name], MD as PCP - General (Family Medicine) [name], MD as Consulting Physician (General Surgery) Chauncey Cruel, MD as Consulting Physician (Oncology) Thea Silversmith, MD as Consulting Physician (Radiation Oncology) [name], MD as Consulting Physician (Gynecology)  CHIEF COMPLAINT: Newly diagnosed breast cancer  CURRENT TREATMENT: On neoadjuvant tamoxifen   BREAST CANCER HISTORY: From the original intake note:  Brandi Chen herself noted a change in her right breast while putting on a bathing suit. She immediately contacted Dr. Phineas Real, who works here in and confirmed a mass in the upper outer quadrant of the right breast, which she was unable to aspirate. He set the patient up for mammography at the breast Center 05/03/2014, and this confirmed a mass with lobulated margins in the upper outer quadrant which was easily palpable and which by ultrasound was hypoechoic and microlobulated, measuring 3.7 cm. The right axilla was unremarkable.  Biopsy of this mass 05/04/2014 showed ([id number]) and invasive ductal carcinoma, grade 2, estrogen and progesterone receptor positive, with an MIB-1 of 15%, and no HER-2 amplification, the signals ratio being 0.94 and the number per cell 1.70.  The patient's subsequent history is as detailed below.  INTERVAL HISTORY: Brandi Chen returns to the breast clinic accompanied by her sister Erby Pian. Since the last visit here Brandi Chen made a firm decision that she does not want adjuvant chemotherapy. She never did seek a second opinion. She started tamoxifen 06/04/2014. She feels it is already working.  REVIEW OF SYSTEMS: She's had no problems with hot flashes or vaginal wetness. However she has become constipated. She tells me that she has a craving for junk food and is eating  [employer] she's NT sandwiches. She also has some nausea. She was taking the tamoxifen initially with breakfast, but has switched to night time. The nausea is not related to the medication taking itself, but can happen at any time during the day. She does not actually vomit. Aside from these issues she is not exercising. Sometimes when she walks up stairs she has a little bit of low back pain. She feels forgetful, anxious, and depressed. She wonders if she should proceed with her Pap smear and colonoscopy. A detailed review of systems today was otherwise stable  PAST MEDICAL HISTORY: Past Medical History  Diagnosis Date  . ADD (attention deficit disorder)   . Anxiety   . Basal cell cancer   . Breast cancer   . Depression     PAST SURGICAL HISTORY: Past Surgical History  Procedure Laterality Date  . Basal cell excised      FAMILY HISTORY Family History  Problem Relation Age of Onset  . Cancer Mother     bladder  . Hypertension Father   . Breast cancer Paternal Aunt 33  . Cancer Maternal Grandmother     ovarian/uterine   the patient's father died at the age of 31 either from a myocardial infarction or a stroke. The patient's mother died at the age of 72 from metastatic bladder cancer. She was a heavy smoker. The patient has one brother and one sister. There is no other cancer in the immediate family, although on the mother's side 1 and was diagnosed with what may have been cervical or ovarian cancer late in life, and on the father's side there was an aunt with breast cancer diagnosed in  her late 37s  GYNECOLOGIC HISTORY:  Patient's last menstrual period was 08/27/2008. Menarche age 67, the patient is GX P0. She stopped having periods in 2009. She did not take hormone replacement. She did take Depo-Provera for many years because of heavy periods remotely.  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 She is starting her on  business which includes dog sitting, painting, doing grocery shopping, and generally being a "girl Friday". She is divorced and lives with a roommate [name] who is disabled secondary to fibromyalgia. The patient's husband Barnabas Lister, who is a survivor of colon cancer, has his own locksmith business. The patient's sister Erby Pian is a stay-at-home mom here in [place]. The patient's brother lives in [place] where he works in conservation. The patient attends a [employer]    ADVANCED DIRECTIVES: Not in place. At the time of the patient's 05/12/2014 visit she was given the patient was given the appropriate documents to complete and notarize at her discretion   HEALTH MAINTENANCE: History  Substance Use Topics  . Smoking status: Never Smoker   . Smokeless tobacco: Never Used  . Alcohol Use: 4.2 oz/week    7 Glasses of wine per week     Comment: social     Colonoscopy:  PAP:  Bone density:  Lipid panel:  No Known Allergies  Current Outpatient Prescriptions  Medication Sig Dispense Refill  . amphetamine-dextroamphetamine (ADDERALL) 10 MG tablet Take 20 mg by mouth 2 (two) times daily.        . diazepam (VALIUM) 10 MG tablet Take 10 mg by mouth as directed. 2 tablets (75m) in am may repeat 123min afternoon if needed      . divalproex (DEPAKOTE) 250 MG DR tablet Take 500 mg by mouth 3 (three) times daily.       . Marland Kitchenlucosamine-chondroitin 500-400 MG tablet Take 1 tablet by mouth 3 (three) times daily.         No current facility-administered medications for this visit.    OBJECTIVE: Middle-aged white woman who appears stated age Fi24itals:   07/26/14 1024  BP: 124/81  Pulse: 81  Temp: 98.3 F (36.8 C)  Resp: 18     Body mass index is 26.28 kg/(m^2).    ECOG FS:1 - Symptomatic but completely ambulatory  Ocular: Sclerae unicteric, pupils round and equal Ear-nose-throat: Oropharynx clear, no thrush or other lesions Lymphatic: No cervical or supraclavicular  adenopathy Lungs no rales or rhonchi Heart regular rate and rhythm Abd soft, nontender, positive bowel sounds MSK no focal spinal tenderness, no joint edema Neuro: non-focal, well-oriented, anxious affect Breasts: The mass in the lower outer quadrant of the right breast no longer distorts the breast silhouette. It appears a little smaller and little softer. There is no skin or nipple change of concern. The right axilla is benign. Left breast is unremarkable.  LAB RESULTS:  CMP     Component Value Date/Time   NA 141 05/12/2014 0806   K 4.5 05/12/2014 0806   CO2 28 05/12/2014 0806   GLUCOSE 98 05/12/2014 0806   BUN 12.4 05/12/2014 0806   CREATININE 0.8 05/12/2014 0806   CALCIUM 9.7 05/12/2014 0806   PROT 7.1 05/12/2014 0806   ALBUMIN 3.9 05/12/2014 0806   AST 15 05/12/2014 0806   ALT 15 05/12/2014 0806   ALKPHOS 54 05/12/2014 0806   BILITOT 0.48 05/12/2014 0806    I No results found  for this basename: SPEP,  UPEP,   kappa and lambda light chains    Lab Results  Component Value Date   WBC 5.5 05/12/2014   NEUTROABS 3.2 05/12/2014   HGB 13.5 05/12/2014   HCT 40.8 05/12/2014   MCV 95.0 05/12/2014   PLT 220 05/12/2014      Chemistry      Component Value Date/Time   NA 141 05/12/2014 0806   K 4.5 05/12/2014 0806   CO2 28 05/12/2014 0806   BUN 12.4 05/12/2014 0806   CREATININE 0.8 05/12/2014 0806      Component Value Date/Time   CALCIUM 9.7 05/12/2014 0806   ALKPHOS 54 05/12/2014 0806   AST 15 05/12/2014 0806   ALT 15 05/12/2014 0806   BILITOT 0.48 05/12/2014 0806       No results found for this basename: LABCA2    No components found with this basename: LABCA125    No results found for this basename: INR,  in the last 168 hours  Urinalysis    Component Value Date/Time   COLORURINE YELLOW 08/06/2013 [place] 08/06/2013 0852   LABSPEC 1.011 08/06/2013 0852   PHURINE 6.0 08/06/2013 0852   GLUCOSEU NEG 08/06/2013 0852   HGBUR NEG 08/06/2013 0852   BILIRUBINUR  NEG 08/06/2013 0852   KETONESUR NEG 08/06/2013 0852   PROTEINUR NEG 08/06/2013 0852   UROBILINOGEN 0.2 08/06/2013 0852   NITRITE NEG 08/06/2013 0852   LEUKOCYTESUR NEG 08/06/2013 0852    STUDIES: No results found.  ASSESSMENT: 53 y.o. [place] woman status post right upper outer quadrant breast biopsy 05/04/2014 for a clinical T2 N0, stage IIA invasive ductal carcinoma, grade 2, estrogen and progesterone receptor positive, HER-2 not amplified, with an MIB-1 of 15%  (1) Oncotype score of 19 predicts and out of the breast recurrence within 10 years of 12% of the patient's only systemic therapy is tamoxifen for 5 years. It also predicts additional 3-4% risk reduction with CAF chemotherapy  (2) the patient decided against adjuvant chemotherapy  (3) started neoadjuvant tamoxifen 06/04/2014  PLAN: I spent approximately 50 minutes with came today going over her situation. She is now comfortable with her decision to not receive chemotherapy. She is learning to live with the tamoxifen. She does seem to be tolerating it remarkably well. I don't think the constipation and nausea that she is experiencing is going to be related to it. Possibly it is due to her change in diet. We discussed at length today the benefits of a diet that is high in vegetables with some meat and lowering carbohydrates and fat.  There is some evidence already that the tamoxifen is working. I think she is going to be able to have a lumpectomy and not a mastectomy. She understands that means she will need post surgical radiation. The plan for now is to continue tamoxifen for total of 6 months and reassess with an MRI.  She does remain very anxious. She feels the Valium is helping. On the other hand it may be making her a little bit sleepy. I think he would be terrific if she started an exercise program and I gave her information on the [employer] program. She also is interested in your and we have a year-old program here she is  very welcome to participate.  I offered her a flu shot, but she "hates those" and "doesn't believe in them". She is going to make sure she washes her hand thoroughly particularly as she has a new  job over the holidays involving money and dealing with the public. I encouraged her to make sure she keeps her health maintenance up-to-date and that means her Pap smear and your colonoscopy.  Otherwise she will see [place] again in 3 months. If she is doing well on tamoxifen that time I will start seeing her on an every 6 month basis until she completes her first 2 years, and then at longer intervals.  Chauncey Cruel, MD   07/26/2014 10:47 AM

## 2014-07-26 NOTE — Telephone Encounter (Signed)
, °

## 2014-07-28 ENCOUNTER — Other Ambulatory Visit: Payer: Self-pay | Admitting: *Deleted

## 2014-07-28 MED ORDER — PROCHLORPERAZINE MALEATE 10 MG PO TABS: 10.0000 mg | ORAL_TABLET | Freq: Four times a day (QID) | ORAL | Status: DC | PRN

## 2014-08-03 ENCOUNTER — Encounter: Payer: Self-pay | Admitting: Women's Health

## 2014-08-03 ENCOUNTER — Ambulatory Visit (INDEPENDENT_AMBULATORY_CARE_PROVIDER_SITE_OTHER): Payer: 59 | Admitting: Women's Health

## 2014-08-03 DIAGNOSIS — N898 Other specified noninflammatory disorders of vagina: Secondary | ICD-10-CM

## 2014-08-03 DIAGNOSIS — L298 Other pruritus: Secondary | ICD-10-CM

## 2014-08-03 DIAGNOSIS — B9689 Other specified bacterial agents as the cause of diseases classified elsewhere: Secondary | ICD-10-CM

## 2014-08-03 DIAGNOSIS — A499 Bacterial infection, unspecified: Secondary | ICD-10-CM

## 2014-08-03 DIAGNOSIS — Z113 Encounter for screening for infections with a predominantly sexual mode of transmission: Secondary | ICD-10-CM

## 2014-08-03 DIAGNOSIS — N760 Acute vaginitis: Secondary | ICD-10-CM

## 2014-08-03 LAB — WET PREP FOR TRICH, YEAST, CLUE
Trich, Wet Prep: NONE SEEN
Yeast Wet Prep HPF POC: NONE SEEN

## 2014-08-03 MED ORDER — METRONIDAZOLE 0.75 % VA GEL: VAGINAL | Status: DC

## 2014-08-03 MED ORDER — CEFIXIME 400 MG PO TABS: 400.0000 mg | ORAL_TABLET | Freq: Every day | ORAL | Status: DC

## 2014-08-03 MED ORDER — AZITHROMYCIN 1 G PO PACK: 1.0000 | PACK | Freq: Once | ORAL | Status: DC

## 2014-08-03 NOTE — Progress Notes (Signed)
Patient ID: Brandi Chen, female   DOB: [birth year], 53 y.o.   MRN: [id number] Presents with complaint of vaginal itching/irritation for several days with no relief with AZO yeast tablets. Unfaithful expartner  had a partner test positive for GC. Denies urinary symptoms, abdominal pain or fever. Postmenopausal on no HRT with no bleeding.  Right Breast cancer on tamoxifen with scheduled follow up with Dr Jana Hakim.    Exam: appears well but worried.  External genitalia erythematous, speculum exam, scant discharge, wet prep pos for amines, clues, TNTC bacteria, GC/chlamydia culture pending.  Bimanual no CMT or tenderness.  GC exposure STD screen BV  Plan: Zithromax 1 gm po 1 dose, suprax 400 mg 1 dose, Metrogel vaginal cream 1 applicator HS for 5 doses, ETOH precautions reviewed. HIV, Hepatitis, B,C, RPR pending.

## 2014-08-03 NOTE — Patient Instructions (Signed)
Bacterial Vaginosis Bacterial vaginosis is an infection of the vagina. It happens when too many of certain germs (bacteria) grow in the vagina. HOME CARE  Take your medicine as told by your doctor.  Finish your medicine even if you start to feel better.  Do not have sex until you finish your medicine and are better.  Tell your sex partner that you have an infection. They should see their doctor for treatment.  Practice safe sex. Use condoms. Have only one sex partner. GET HELP IF:  You are not getting better after 3 days of treatment.  You have more grey fluid (discharge) coming from your vagina than before.  You have more pain than before.  You have a fever. MAKE SURE YOU:   Understand these instructions.  Will watch your condition.  Will get help right away if you are not doing well or get worse. Document Released: 07/17/2008 Document Revised: 07/29/2013 Document Reviewed: 05/20/2013 ExitCare Patient Information 2015 [employer]. This information is not intended to replace advice given to you by your health care provider. Make sure you discuss any questions you have with your health care provider.  

## 2014-08-04 LAB — GC/CHLAMYDIA PROBE AMP
CT PROBE, AMP APTIMA: POSITIVE — AB
GC PROBE AMP APTIMA: NEGATIVE

## 2014-08-04 LAB — HIV ANTIBODY (ROUTINE TESTING W REFLEX): HIV 1&2 Ab, 4th Generation: NONREACTIVE

## 2014-08-04 LAB — RPR

## 2014-08-04 LAB — HEPATITIS B SURFACE ANTIGEN: Hepatitis B Surface Ag: NEGATIVE

## 2014-08-04 LAB — HEPATITIS C ANTIBODY: HCV Ab: NEGATIVE

## 2014-08-24 ENCOUNTER — Ambulatory Visit (INDEPENDENT_AMBULATORY_CARE_PROVIDER_SITE_OTHER): Payer: 59 | Admitting: Gynecology

## 2014-08-24 ENCOUNTER — Encounter: Payer: Self-pay | Admitting: Gynecology

## 2014-08-24 DIAGNOSIS — N898 Other specified noninflammatory disorders of vagina: Secondary | ICD-10-CM

## 2014-08-24 DIAGNOSIS — Z113 Encounter for screening for infections with a predominantly sexual mode of transmission: Secondary | ICD-10-CM

## 2014-08-24 LAB — WET PREP FOR TRICH, YEAST, CLUE: TRICH WET PREP: NONE SEEN

## 2014-08-24 MED ORDER — FLUCONAZOLE 150 MG PO TABS: 150.0000 mg | ORAL_TABLET | Freq: Once | ORAL | Status: DC

## 2014-08-24 NOTE — Patient Instructions (Signed)
Take the one Diflucan pill for the vaginal irritation. Repeated in several days if the irritation continues.  Follow up in several months to repeat the blood STD screening tests.

## 2014-08-24 NOTE — Progress Notes (Signed)
Brandi Chen 22-Nov-1960 [id number]        53 y.o.  G0P0 Presents for test of cure with recent positive chlamydia screen. Patient was notified by her boyfriend that he was positive for GC and syphilis. She had a negative RPR HIV hepatitis B and hepatitis C October 13. She did have a positive Chlamydia negative GC. She was treated with azithromycin and Suprax at that time. Now she notes some vaginal irritation of the last several days and mild dysuria.  Past medical history,surgical history, problem list, medications, allergies, family history and social history were all reviewed and documented in the EPIC chart.  Directed ROS with pertinent positives and negatives documented in the history of present illness/assessment and plan.  Exam: [name] General appearance:  Normal Abdomen soft nontender without masses guarding rebound Pelvic external BUS vagina with mild diffuse erythema. No specific lesions. Thick white cakey discharge noted. Cervix normal. Uterus normal size midline mobile nontender. Adnexa without masses or tenderness.  Assessment/Plan:  53 y.o. G0P0 with positive Chlamydia, exposure to GC and syphilis. GC/syphilis testing negative. Treated for chlamydia now for test of cure. GC/chlamydia screen done. Wet prep is positive for yeast.  Treatment Diflucan 150 mg 1 dose. Extra pill given to re-dose in several days if symptoms persist. Follow up on the GC/Chlamydia screen. Recommend repeating her serum screening in 3-6 months to assure she does not convert given the exposure history of being mid October. Future orders placed for these and she knows to come back and have done.  Patient is due for her annual exam and will follow up in several weeks for that as scheduled.     [name] MD, 11:48 AM 08/24/2014

## 2014-08-25 ENCOUNTER — Other Ambulatory Visit: Payer: Self-pay

## 2014-08-25 ENCOUNTER — Other Ambulatory Visit: Payer: Self-pay | Admitting: Gynecology

## 2014-08-25 LAB — URINALYSIS W MICROSCOPIC + REFLEX CULTURE
Bilirubin Urine: NEGATIVE
CASTS: NONE SEEN
Crystals: NONE SEEN
Glucose, UA: NEGATIVE mg/dL
HGB URINE DIPSTICK: NEGATIVE
KETONES UR: NEGATIVE mg/dL
NITRITE: NEGATIVE
PH: 7 (ref 5.0–8.0)
PROTEIN: NEGATIVE mg/dL
Specific Gravity, Urine: 1.021 (ref 1.005–1.030)
Urobilinogen, UA: 0.2 mg/dL (ref 0.0–1.0)

## 2014-08-25 LAB — GC/CHLAMYDIA PROBE AMP
CT Probe RNA: NEGATIVE
GC PROBE AMP APTIMA: POSITIVE — AB

## 2014-08-25 MED ORDER — CEFIXIME 400 MG PO CAPS
400.0000 mg | ORAL_CAPSULE | Freq: Once | ORAL | Status: DC
Start: 2014-08-25 — End: 2014-09-23

## 2014-08-26 ENCOUNTER — Telehealth: Payer: Self-pay

## 2014-08-26 NOTE — Telephone Encounter (Signed)
I called in the Suprax 400mg  per your note.  Brandy at the [facility] said that is not enough for treatment.  She said standard recommendation os 1 GM Zithromax and 250 Rocephin IM.  She said since patient already has the Suprax that she thought the State would be fine with you just adding the Zithromax 1 gram.

## 2014-08-26 NOTE — Telephone Encounter (Signed)
Patient informed. 

## 2014-08-26 NOTE — Telephone Encounter (Signed)
She was already treated with Zithromax at her last visit when she had a positive chlamydia. If you could check with the health department to see if they're okay with that if not then go ahead and prescribed the Zithromax 1 g

## 2014-08-26 NOTE — Telephone Encounter (Signed)
Patient had relayed to me yesterday that when she received pos chlamydia results earlier in Oct she had had sex one time with someone while she and boyfriend were broken up.   She is now reunited with her boyfriend and has this positive GC culture. She asked if it could have been in her system back when she had the chlamydia and just didn't show on culture. She asked if there were any symptoms her boyfriend might have. She is going to call and tell him.

## 2014-08-26 NOTE — Telephone Encounter (Signed)
These tests are very sensitive and it is unlikely that she had it when screened before although I cannot guarantee that there are failures in the test sometime.  Main symptoms for a guy would be burning with urination and urethral irritation.

## 2014-08-27 ENCOUNTER — Other Ambulatory Visit: Payer: Self-pay | Admitting: Gynecology

## 2014-08-27 LAB — URINE CULTURE

## 2014-08-27 MED ORDER — AZITHROMYCIN 500 MG PO TABS: 1000.0000 mg | ORAL_TABLET | Freq: Once | ORAL | Status: DC

## 2014-08-27 NOTE — Telephone Encounter (Signed)
Brandi Chen at [facility] said that [facility] requires dual Rx for Gonorrhea and prefer Rocephin and Zithromax but since patient already has Suprax to go ahead and prescribe the Zithromax.   Patient was advised regarding need for additional round of Zithromax and Rx was sent to her pharmacy.

## 2014-09-23 ENCOUNTER — Encounter: Payer: Self-pay | Admitting: Gynecology

## 2014-09-23 ENCOUNTER — Ambulatory Visit (INDEPENDENT_AMBULATORY_CARE_PROVIDER_SITE_OTHER): Payer: 59 | Admitting: Gynecology

## 2014-09-23 DIAGNOSIS — Z113 Encounter for screening for infections with a predominantly sexual mode of transmission: Secondary | ICD-10-CM

## 2014-09-23 NOTE — Patient Instructions (Signed)
Office will call you with the culture results.

## 2014-09-23 NOTE — Progress Notes (Signed)
Brandi Chen [birth year] [id number]        53 y.o.  G0P0 patient presents for test of cure. History of positive chlamydia negative gonorrhea in October. Was treated with antibiotics with follow up test of cure November with positive gonorrhea negative Chlamydia. Was retreated with antibiotics and now presents for test of cure. Patients without complaints. Has annual exam scheduled next week.  Past medical history,surgical history, problem list, medications, allergies, family history and social history were all reviewed and documented in the EPIC chart.  Directed ROS with pertinent positives and negatives documented in the history of present illness/assessment and plan.  Exam: [name] General appearance:  Normal Abdomen soft nontender without masses guarding rebound Pelvic external BUS vagina with atrophic changes. Cervix normal. GC/chlamydia screen done  Uterus normal size midline mobile nontender. Adnexa without masses or tenderness.  Assessment/Plan:  53 y.o. G0P0 with history of recent chlamydia and gonorrhea. Here for test of cure. Patient will follow up for GC chlamydia results.     [name] MD, 12:32 PM 09/23/2014

## 2014-09-24 LAB — GC/CHLAMYDIA PROBE AMP
CT PROBE, AMP APTIMA: NEGATIVE
GC Probe RNA: NEGATIVE

## 2014-09-30 ENCOUNTER — Encounter: Payer: Self-pay | Admitting: Gynecology

## 2014-09-30 ENCOUNTER — Ambulatory Visit (INDEPENDENT_AMBULATORY_CARE_PROVIDER_SITE_OTHER): Payer: 59 | Admitting: Gynecology

## 2014-09-30 VITALS — BP 120/76 | Ht 65.0 in | Wt 155.0 lb

## 2014-09-30 DIAGNOSIS — C50911 Malignant neoplasm of unspecified site of right female breast: Secondary | ICD-10-CM

## 2014-09-30 DIAGNOSIS — Z01419 Encounter for gynecological examination (general) (routine) without abnormal findings: Secondary | ICD-10-CM

## 2014-09-30 DIAGNOSIS — Z113 Encounter for screening for infections with a predominantly sexual mode of transmission: Secondary | ICD-10-CM

## 2014-09-30 NOTE — Patient Instructions (Signed)
Follow up in several months for repeat of your STD screens.  You may obtain a copy of any labs that were done today by logging onto MyChart as outlined in the instructions provided with your AVS (after visit summary). The office will not call with normal lab results but certainly if there are any significant abnormalities then we will contact you.   Health Maintenance, Female A healthy lifestyle and preventative care can promote health and wellness.  Maintain regular health, dental, and eye exams.  Eat a healthy diet. Foods like vegetables, fruits, whole grains, low-fat dairy products, and lean protein foods contain the nutrients you need without too many calories. Decrease your intake of foods high in solid fats, added sugars, and salt. Get information about a proper diet from your caregiver, if necessary.  Regular physical exercise is one of the most important things you can do for your health. Most adults should get at least 150 minutes of moderate-intensity exercise (any activity that increases your heart rate and causes you to sweat) each week. In addition, most adults need muscle-strengthening exercises on 2 or more days a week.   Maintain a healthy weight. The body mass index (BMI) is a screening tool to identify possible weight problems. It provides an estimate of body fat based on height and weight. Your caregiver can help determine your BMI, and can help you achieve or maintain a healthy weight. For adults 20 years and older:  A BMI below 18.5 is considered underweight.  A BMI of 18.5 to 24.9 is normal.  A BMI of 25 to 29.9 is considered overweight.  A BMI of 30 and above is considered obese.  Maintain normal blood lipids and cholesterol by exercising and minimizing your intake of saturated fat. Eat a balanced diet with plenty of fruits and vegetables. Blood tests for lipids and cholesterol should begin at age 2 and be repeated every 5 years. If your lipid or cholesterol levels are  high, you are over 50, or you are a high risk for heart disease, you may need your cholesterol levels checked more frequently.Ongoing high lipid and cholesterol levels should be treated with medicines if diet and exercise are not effective.  If you smoke, find out from your caregiver how to quit. If you do not use tobacco, do not start.  Lung cancer screening is recommended for adults aged 62 80 years who are at high risk for developing lung cancer because of a history of smoking. Yearly low-dose computed tomography (CT) is recommended for people who have at least a 30-pack-year history of smoking and are a current smoker or have quit within the past 15 years. A pack year of smoking is smoking an average of 1 pack of cigarettes a day for 1 year (for example: 1 pack a day for 30 years or 2 packs a day for 15 years). Yearly screening should continue until the smoker has stopped smoking for at least 15 years. Yearly screening should also be stopped for people who develop a health problem that would prevent them from having lung cancer treatment.  If you are pregnant, do not drink alcohol. If you are breastfeeding, be very cautious about drinking alcohol. If you are not pregnant and choose to drink alcohol, do not exceed 1 drink per day. One drink is considered to be 12 ounces (355 mL) of beer, 5 ounces (148 mL) of wine, or 1.5 ounces (44 mL) of liquor.  Avoid use of street drugs. Do not share needles with  anyone. Ask for help if you need support or instructions about stopping the use of drugs.  High blood pressure causes heart disease and increases the risk of stroke. Blood pressure should be checked at least every 1 to 2 years. Ongoing high blood pressure should be treated with medicines, if weight loss and exercise are not effective.  If you are 55 to 53 years old, ask your caregiver if you should take aspirin to prevent strokes.  Diabetes screening involves taking a blood sample to check your fasting  blood sugar level. This should be done once every 3 years, after age 45, if you are within normal weight and without risk factors for diabetes. Testing should be considered at a younger age or be carried out more frequently if you are overweight and have at least 1 risk factor for diabetes.  Breast cancer screening is essential preventative care for women. You should practice "breast self-awareness." This means understanding the normal appearance and feel of your breasts and may include breast self-examination. Any changes detected, no matter how small, should be reported to a caregiver. Women in their 20s and 30s should have a clinical breast exam (CBE) by a caregiver as part of a regular health exam every 1 to 3 years. After age 40, women should have a CBE every year. Starting at age 40, women should consider having a mammogram (breast X-ray) every year. Women who have a family history of breast cancer should talk to their caregiver about genetic screening. Women at a high risk of breast cancer should talk to their caregiver about having an MRI and a mammogram every year.  Breast cancer gene (BRCA)-related cancer risk assessment is recommended for women who have family members with BRCA-related cancers. BRCA-related cancers include breast, ovarian, tubal, and peritoneal cancers. Having family members with these cancers may be associated with an increased risk for harmful changes (mutations) in the breast cancer genes BRCA1 and BRCA2. Results of the assessment will determine the need for genetic counseling and BRCA1 and BRCA2 testing.  The Pap test is a screening test for cervical cancer. Women should have a Pap test starting at age 21. Between ages 21 and 29, Pap tests should be repeated every 2 years. Beginning at age 30, you should have a Pap test every 3 years as long as the past 3 Pap tests have been normal. If you had a hysterectomy for a problem that was not cancer or a condition that could lead to  cancer, then you no longer need Pap tests. If you are between ages 65 and 70, and you have had normal Pap tests going back 10 years, you no longer need Pap tests. If you have had past treatment for cervical cancer or a condition that could lead to cancer, you need Pap tests and screening for cancer for at least 20 years after your treatment. If Pap tests have been discontinued, risk factors (such as a new sexual partner) need to be reassessed to determine if screening should be resumed. Some women have medical problems that increase the chance of getting cervical cancer. In these cases, your caregiver may recommend more frequent screening and Pap tests.  The human papillomavirus (HPV) test is an additional test that may be used for cervical cancer screening. The HPV test looks for the virus that can cause the cell changes on the cervix. The cells collected during the Pap test can be tested for HPV. The HPV test could be used to screen women aged 30   years and older, and should be used in women of any age who have unclear Pap test results. After the age of 30, women should have HPV testing at the same frequency as a Pap test.  Colorectal cancer can be detected and often prevented. Most routine colorectal cancer screening begins at the age of 50 and continues through age 75. However, your caregiver may recommend screening at an earlier age if you have risk factors for colon cancer. On a yearly basis, your caregiver may provide home test kits to check for hidden blood in the stool. Use of a small camera at the end of a tube, to directly examine the colon (sigmoidoscopy or colonoscopy), can detect the earliest forms of colorectal cancer. Talk to your caregiver about this at age 50, when routine screening begins. Direct examination of the colon should be repeated every 5 to 10 years through age 75, unless early forms of pre-cancerous polyps or small growths are found.  Hepatitis C blood testing is recommended for  all people born from 1945 through 1965 and any individual with known risks for hepatitis C.  Practice safe sex. Use condoms and avoid high-risk sexual practices to reduce the spread of sexually transmitted infections (STIs). Sexually active women aged 25 and younger should be checked for Chlamydia, which is a common sexually transmitted infection. Older women with new or multiple partners should also be tested for Chlamydia. Testing for other STIs is recommended if you are sexually active and at increased risk.  Osteoporosis is a disease in which the bones lose minerals and strength with aging. This can result in serious bone fractures. The risk of osteoporosis can be identified using a bone density scan. Women ages 65 and over and women at risk for fractures or osteoporosis should discuss screening with their caregivers. Ask your caregiver whether you should be taking a calcium supplement or vitamin D to reduce the rate of osteoporosis.  Menopause can be associated with physical symptoms and risks. Hormone replacement therapy is available to decrease symptoms and risks. You should talk to your caregiver about whether hormone replacement therapy is right for you.  Use sunscreen. Apply sunscreen liberally and repeatedly throughout the day. You should seek shade when your shadow is shorter than you. Protect yourself by wearing long sleeves, pants, a wide-brimmed hat, and sunglasses year round, whenever you are outdoors.  Notify your caregiver of new moles or changes in moles, especially if there is a change in shape or color. Also notify your caregiver if a mole is larger than the size of a pencil eraser.  Stay current with your immunizations. Document Released: 04/23/2011 Document Revised: 02/02/2013 Document Reviewed: 04/23/2011 [employer] Patient Information 2014 [employer].   

## 2014-09-30 NOTE — Progress Notes (Signed)
Brandi Chen 1961/10/03 [id number]        53 y.o.  G0P0 for annual exam.  Several issues noted below.  Past medical history,surgical history, problem list, medications, allergies, family history and social history were all reviewed and documented as reviewed in the EPIC chart.  ROS:  12 system ROS performed with pertinent positives and negatives included in the history, assessment and plan.   Additional significant findings :  none   Exam: [name] Vitals:   09/30/14 1053  BP: 120/76  Height: 5\' 5"  (4.580 m)  Weight: 155 lb (70.308 kg)   General appearance:  Normal affect, orientation and appearance. Skin: Grossly normal HEENT: Without gross lesions.  No cervical or supraclavicular adenopathy. Thyroid normal.  Lungs:  Clear without wheezing, rales or rhonchi Cardiac: RR, without RMG Abdominal:  Soft, nontender, without masses, guarding, rebound, organomegaly or hernia Breasts:  Examined lying and sitting. Left without masses, retractions, discharge or axillary adenopathy.  Right with 3 cm firm mass border of the areola 9 to 12:00. Slight puckering of overlying skin. Without other masses retractions discharge adenopathy. Pelvic:  Ext/BUS/vagina with mild atrophic changes  Cervix with mild atrophic changes  Uterus anteverted, normal size, shape and contour, midline and mobile nontender   Adnexa  Without masses or tenderness    Anus and perineum  Normal   Rectovaginal  Normal sphincter tone without palpated masses or tenderness.    Assessment/Plan:  53 y.o. G0P0 female for annual exam.   1. Postmenopausal. Doing well without significant hot flushes, night sweats, vaginal dryness or dyspareunia. No vaginal bleeding. Continue to monitor. 2. Right breast cancer. Currently on tamoxifen with planned follow up surgery and treatment per Dr. Jana Hakim. Actively being followed by him. 3. DEXA 2012 normal. Plan repeat at age 24. Increased calcium vitamin D reviewed. 4. Pap  smear/HPV negative 2014. No Pap smear done today.  Plan repeat at 3-5 year interval. 5. Colonoscopy never. Patient is planning after her breast surgery. Patient knows the need to do so. 6. STD screening. Recently treated for both chlamydia and gonorrhea. Not sexually active now. Had negative serum screening. Planned 3-6 month follow up serum screening for completeness. Future order placed and she'll come back to have this done. 7. Health maintenance. Baseline CBC comprehensive metabolic panel lipid profile urine analysis TSH vitamin D ordered.  Follow up in several months for blood tests as noted above otherwise 1 year for annual exam.    [name] MD, 11:26 AM 09/30/2014

## 2014-10-01 ENCOUNTER — Other Ambulatory Visit: Payer: Self-pay | Admitting: Gynecology

## 2014-10-01 DIAGNOSIS — E559 Vitamin D deficiency, unspecified: Secondary | ICD-10-CM

## 2014-10-01 LAB — LIPID PANEL
CHOL/HDL RATIO: 2.4 ratio
Cholesterol: 159 mg/dL (ref 0–200)
HDL: 67 mg/dL (ref >39)
LDL CALC: 74 mg/dL (ref 0–99)
Triglycerides: 92 mg/dL (ref <150)
VLDL: 18 mg/dL (ref 0–40)

## 2014-10-01 LAB — CBC WITH DIFFERENTIAL/PLATELET
Basophils Absolute: 0 10*3/uL (ref 0.0–0.1)
Basophils Relative: 0 % (ref 0–1)
EOS ABS: 0 10*3/uL (ref 0.0–0.7)
Eosinophils Relative: 1 % (ref 0–5)
HCT: 39.2 % (ref 36.0–46.0)
HEMOGLOBIN: 13.2 g/dL (ref 12.0–15.0)
LYMPHS ABS: 1.9 10*3/uL (ref 0.7–4.0)
LYMPHS PCT: 42 % (ref 12–46)
MCH: 31.7 pg (ref 26.0–34.0)
MCHC: 33.7 g/dL (ref 30.0–36.0)
MCV: 94 fL (ref 78.0–100.0)
MONO ABS: 0.6 10*3/uL (ref 0.1–1.0)
MPV: 10.5 fL (ref 9.4–12.4)
Monocytes Relative: 14 % — ABNORMAL HIGH (ref 3–12)
Neutro Abs: 2 10*3/uL (ref 1.7–7.7)
Neutrophils Relative %: 43 % (ref 43–77)
PLATELETS: 174 10*3/uL (ref 150–400)
RBC: 4.17 MIL/uL (ref 3.87–5.11)
RDW: 12.5 % (ref 11.5–15.5)
WBC: 4.6 10*3/uL (ref 4.0–10.5)

## 2014-10-01 LAB — COMPREHENSIVE METABOLIC PANEL
ALBUMIN: 4 g/dL (ref 3.5–5.2)
ALT: 9 U/L (ref 0–35)
AST: 14 U/L (ref 0–37)
Alkaline Phosphatase: 41 U/L (ref 39–117)
BILIRUBIN TOTAL: 0.4 mg/dL (ref 0.2–1.2)
BUN: 14 mg/dL (ref 6–23)
CO2: 30 mEq/L (ref 19–32)
Calcium: 9 mg/dL (ref 8.4–10.5)
Chloride: 97 mEq/L (ref 96–112)
Creat: 0.71 mg/dL (ref 0.50–1.10)
GLUCOSE: 91 mg/dL (ref 70–99)
POTASSIUM: 4 meq/L (ref 3.5–5.3)
SODIUM: 137 meq/L (ref 135–145)
TOTAL PROTEIN: 6.5 g/dL (ref 6.0–8.3)

## 2014-10-01 LAB — URINALYSIS W MICROSCOPIC + REFLEX CULTURE
Bacteria, UA: NONE SEEN
Bilirubin Urine: NEGATIVE
Casts: NONE SEEN
Crystals: NONE SEEN
Glucose, UA: NEGATIVE mg/dL
HGB URINE DIPSTICK: NEGATIVE
Ketones, ur: NEGATIVE mg/dL
LEUKOCYTES UA: NEGATIVE
NITRITE: NEGATIVE
Protein, ur: NEGATIVE mg/dL
SQUAMOUS EPITHELIAL / LPF: NONE SEEN
Specific Gravity, Urine: 1.017 (ref 1.005–1.030)
UROBILINOGEN UA: 0.2 mg/dL (ref 0.0–1.0)
pH: 6.5 (ref 5.0–8.0)

## 2014-10-01 LAB — VITAMIN D 25 HYDROXY (VIT D DEFICIENCY, FRACTURES): VIT D 25 HYDROXY: 24 ng/mL — AB (ref 30–100)

## 2014-10-01 LAB — TSH: TSH: 3.593 u[IU]/mL (ref 0.350–4.500)

## 2014-10-08 ENCOUNTER — Telehealth: Payer: Self-pay | Admitting: *Deleted

## 2014-10-08 NOTE — Telephone Encounter (Signed)
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  Pt responded with stating she has " googled it and read about small kids with asthma that it could cause breathing issues "  This RN discussed above including smells could nausea or headaches.

## 2014-10-28 ENCOUNTER — Other Ambulatory Visit (HOSPITAL_BASED_OUTPATIENT_CLINIC_OR_DEPARTMENT_OTHER): Payer: 59

## 2014-10-28 DIAGNOSIS — C50411 Malignant neoplasm of upper-outer quadrant of right female breast: Secondary | ICD-10-CM

## 2014-10-28 LAB — COMPREHENSIVE METABOLIC PANEL (CC13)
ALBUMIN: 3.4 g/dL — AB (ref 3.5–5.0)
ALT: 10 U/L (ref 0–55)
ANION GAP: 9 meq/L (ref 3–11)
AST: 11 U/L (ref 5–34)
Alkaline Phosphatase: 52 U/L (ref 40–150)
BUN: 15.9 mg/dL (ref 7.0–26.0)
CALCIUM: 9.3 mg/dL (ref 8.4–10.4)
CHLORIDE: 98 meq/L (ref 98–109)
CO2: 32 meq/L — AB (ref 22–29)
Creatinine: 0.8 mg/dL (ref 0.6–1.1)
EGFR: 83 mL/min/{1.73_m2} — AB (ref >90)
GLUCOSE: 89 mg/dL (ref 70–140)
POTASSIUM: 4.4 meq/L (ref 3.5–5.1)
SODIUM: 139 meq/L (ref 136–145)
TOTAL PROTEIN: 6.8 g/dL (ref 6.4–8.3)
Total Bilirubin: 0.37 mg/dL (ref 0.20–1.20)

## 2014-10-28 LAB — CBC WITH DIFFERENTIAL/PLATELET
BASO%: 0.2 % (ref 0.0–2.0)
Basophils Absolute: 0 10*3/uL (ref 0.0–0.1)
EOS ABS: 0.1 10*3/uL (ref 0.0–0.5)
EOS%: 1.1 % (ref 0.0–7.0)
HCT: 39.2 % (ref 34.8–46.6)
HGB: 13.3 g/dL (ref 11.6–15.9)
LYMPH%: 20.7 % (ref 14.0–49.7)
MCH: 32 pg (ref 25.1–34.0)
MCHC: 33.9 g/dL (ref 31.5–36.0)
MCV: 94.2 fL (ref 79.5–101.0)
MONO#: 1.2 10*3/uL — ABNORMAL HIGH (ref 0.1–0.9)
MONO%: 13.1 % (ref 0.0–14.0)
NEUT#: 5.7 10*3/uL (ref 1.5–6.5)
NEUT%: 64.9 % (ref 38.4–76.8)
Platelets: 215 10*3/uL (ref 145–400)
RBC: 4.16 10*6/uL (ref 3.70–5.45)
RDW: 11.4 % (ref 11.2–14.5)
WBC: 8.8 10*3/uL (ref 3.9–10.3)
lymph#: 1.8 10*3/uL (ref 0.9–3.3)

## 2014-10-29 ENCOUNTER — Other Ambulatory Visit: Payer: Self-pay | Admitting: *Deleted

## 2014-10-29 NOTE — Telephone Encounter (Signed)
Pt called to this RN stating concern due to " almost out of my tamoxifen due to insurance issues and they said it is not an urgent medication and I would have to wait ".  Per discussion with pt she states problems with her insurance due to " stating I didn't pay my monthly 57 cents copay then saying I did- my prescription coverage is not going thru "   Pt was very upset over the above due to she is taking the tamoxifen neoadjuvant with good response.  Solace also states she has swollen lymph nodes " below my ears"  Swelling is bilaterally and equal.  Patient states she had an upper respiratory virus last week with low grade fever " but am feeling better now so is this normal ?"  This RN discussed with pt possible drainage post virus and to assist with use of hot liquids including lemon juice.  This RN called to pt's pharmacy and discussed above medication concern with pharmacist- note pharmacy is aware of situation " and we will not let her be without her medication even if we have to give her some at a very low cost but it is best to allow her insurance to be reinstated which could happen before she runs out " Pharmacy states they attempted to discuss this with the patient "but she was very emotional and seemed more focused on why the insurance wasn't paying "  This RN informed pharmacist above plan will be discussed with pt.  This RN returned call to pt and informed her to stay in touch with the pharmacy about medication.  Pt also knows to call if swelling in lymph nodes worsen.

## 2014-11-04 ENCOUNTER — Telehealth: Payer: Self-pay | Admitting: *Deleted

## 2014-11-04 ENCOUNTER — Telehealth: Payer: Self-pay | Admitting: Oncology

## 2014-11-04 ENCOUNTER — Ambulatory Visit (HOSPITAL_BASED_OUTPATIENT_CLINIC_OR_DEPARTMENT_OTHER): Payer: 59 | Admitting: Oncology

## 2014-11-04 VITALS — BP 133/91 | HR 101 | Temp 97.7°F | Resp 18 | Ht 65.0 in | Wt 151.6 lb

## 2014-11-04 DIAGNOSIS — B379 Candidiasis, unspecified: Secondary | ICD-10-CM

## 2014-11-04 DIAGNOSIS — Z170 Estrogen receptor positive status [ER+]: Secondary | ICD-10-CM

## 2014-11-04 DIAGNOSIS — C50411 Malignant neoplasm of upper-outer quadrant of right female breast: Secondary | ICD-10-CM

## 2014-11-04 MED ORDER — FLUCONAZOLE 100 MG PO TABS: 100.0000 mg | ORAL_TABLET | Freq: Every day | ORAL | Status: DC

## 2014-11-04 NOTE — Telephone Encounter (Signed)
Pt has questions about her visit with Dr.Maginat today, all questions answered, but will follow up with imaging as directed.

## 2014-11-04 NOTE — Progress Notes (Signed)
[place]  [phone] [phone]) [phone]     ID: [name] DOB: [birth year]  MR#: [id number]  NMM#:[id number]  Patient Care Team: [name], MD as PCP - General (Family Medicine) [name], MD as Consulting Physician (General Surgery) Chauncey Cruel, MD as Consulting Physician (Oncology) Thea Silversmith, MD as Consulting Physician (Radiation Oncology) [name], MD as Consulting Physician (Gynecology)  CHIEF COMPLAINT: Newly diagnosed breast cancer  CURRENT TREATMENT: On neoadjuvant tamoxifen   BREAST CANCER HISTORY: From the original intake note:  Brandi Chen herself noted a change in her right breast while putting on a bathing suit. She immediately contacted Dr. Phineas Real, who works here in and confirmed a mass in the upper outer quadrant of the right breast, which she was unable to aspirate. He set the patient up for mammography at the [facility] Center 05/03/2014, and this confirmed a mass with lobulated margins in the upper outer quadrant which was easily palpable and which by ultrasound was hypoechoic and microlobulated, measuring 3.7 cm. The right axilla was unremarkable.  Biopsy of this mass 05/04/2014 showed ([id number]) and invasive ductal carcinoma, grade 2, estrogen and progesterone receptor positive, with an MIB-1 of 15%, and no HER-2 amplification, the signals ratio being 0.94 and the number per cell 1.70.  The patient's subsequent history is as detailed below.  INTERVAL HISTORY: Brandi Chen returns to the breast clinic accompanied by her sister Erby Pian. Clair Gulling continues on tamoxifen, and is tolerating that well. She had some nausea initially but that has resolved. Now when she has nausea she eats a peppermint Patti.  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 Unfortunately one of the dogs, 3-year-old, urinates all over the house  [place]earing smell is more than she can tolerate. She may not be able to keep her job as a result. She does feel tired and she has some pain in her calves, possibly because there is a lot of stair climbing at the new job. She had a mild cold and had mild epistaxis when she blew her nose. Her appetite is poor. She is a little bit on the constipated side. She feels forgetful, anxious, and depressed. Her submandibular glands were very swollen bilaterally. There are now a little bit better. A detailed review of systems today was otherwise stable  PAST MEDICAL HISTORY: Past Medical History  Diagnosis Date  . ADD (attention deficit disorder)   . Anxiety   . Depression   . Breast cancer 04/2014    right    PAST SURGICAL HISTORY: Past Surgical History  Procedure Laterality Date  . Basal cell excised      FAMILY HISTORY Family History  Problem Relation Age of Onset  . Cancer Mother     bladder  . Hypertension Father   . Heart attack Father   . Breast cancer Paternal Aunt 57  . Cancer Maternal Grandmother     ovarian/uterine   the patient's father died at the age of 43 either from a myocardial infarction or a stroke. The patient's mother died at the age of 57 from metastatic bladder cancer. She was a heavy smoker. The patient has one brother and one sister. There is no other cancer in the immediate family, although on the mother's side 1 and was diagnosed with what may have been cervical or ovarian cancer late in life, and on the father's side there  was an aunt with breast cancer diagnosed in her late 46s  GYNECOLOGIC HISTORY:  Patient's last menstrual period was 08/27/2008. Menarche age 54, the patient is GX P0. She stopped having periods in 2009. She did not take hormone replacement. She did take Depo-Provera for many years because of heavy periods remotely.  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 She is starting her on business which  includes dog sitting, painting, doing grocery shopping, and generally being a "girl Friday". She is divorced and lives with a roommate [name] who is disabled secondary to fibromyalgia. The patient's husband Barnabas Lister, who is a survivor of colon cancer, has his own locksmith business. The patient's sister Erby Pian is a stay-at-home mom here in [place]. The patient's brother lives in [place] where he works in conservation. The patient attends a [employer]    ADVANCED DIRECTIVES: Not in place. At the time of the patient's 05/12/2014 visit she was given the patient was given the appropriate documents to complete and notarize at her discretion   HEALTH MAINTENANCE: History  Substance Use Topics  . Smoking status: Never Smoker   . Smokeless tobacco: Never Used  . Alcohol Use: 4.2 oz/week    7 Glasses of wine per week     Comment: social     Colonoscopy:  PAP:  Bone density:  Lipid panel:  No Known Allergies  Current Outpatient Prescriptions  Medication Sig Dispense Refill  . amphetamine-dextroamphetamine (ADDERALL) 10 MG tablet Take 20 mg by mouth 2 (two) times daily.     . diazepam (VALIUM) 10 MG tablet Take 10 mg by mouth as directed. 2 tablets (60m) in am may repeat 136min afternoon if needed    . divalproex (DEPAKOTE) 250 MG DR tablet Take 500 mg by mouth 3 (three) times daily.     . Marland Kitchenlucosamine-chondroitin 500-400 MG tablet Take 1 tablet by mouth 3 (three) times daily.      . tamoxifen (NOLVADEX) 20 MG tablet Take 20 mg by mouth daily.     No current facility-administered medications for this visit.    OBJECTIVE: Middle-aged white woman in no acute distress Filed Vitals:   11/04/14 1025  BP: 133/91  Pulse: 101  Temp: 97.7 F (36.5 C)  Resp: 18     Body mass index is 25.23 kg/(m^2).    ECOG FS:1 - Symptomatic but completely ambulatory  Ocular: Sclerae unicteric, EOMs intact Ear-nose-throat: Oropharynx shows thrush Lymphatic: Bilateral submandibular  adenopathy, slightly larger right than left, minimally tender; no cervical or supraclavicular adenopathy Lungs no rales or rhonchi Heart regular rate and rhythm Abd soft, nontender, positive bowel sounds MSK no focal spinal tenderness, no upper extremity lymphedema Neuro: non-focal, well-oriented, anxious affect Breasts: The mass in the lower outer quadrant of the right breast is still palpable immediately lateral to the nipple. It appeals to be drawing the nipple slightly in word on the lateral side. There are no other skin changes. The right axilla is benign. The left breast is unremarkable  LAB RESULTS:  CMP     Component Value Date/Time   NA 139 10/28/2014 0826   NA 137 09/30/2014 1131   K 4.4 10/28/2014 0826   K 4.0 09/30/2014 1131   CL 97 09/30/2014 1131   CO2 32* 10/28/2014 0826   CO2 30 09/30/2014 1131   GLUCOSE 89 10/28/2014 0826   GLUCOSE 91 09/30/2014 1131   BUN 15.9 10/28/2014 0826  BUN 14 09/30/2014 1131   CREATININE 0.8 10/28/2014 0826   CREATININE 0.71 09/30/2014 1131   CALCIUM 9.3 10/28/2014 0826   CALCIUM 9.0 09/30/2014 1131   PROT 6.8 10/28/2014 0826   PROT 6.5 09/30/2014 1131   ALBUMIN 3.4* 10/28/2014 0826   ALBUMIN 4.0 09/30/2014 1131   AST 11 10/28/2014 0826   AST 14 09/30/2014 1131   ALT 10 10/28/2014 0826   ALT 9 09/30/2014 1131   ALKPHOS 52 10/28/2014 0826   ALKPHOS 41 09/30/2014 1131   BILITOT 0.37 10/28/2014 0826   BILITOT 0.4 09/30/2014 1131    I No results found for: SPEP  Lab Results  Component Value Date   WBC 8.8 10/28/2014   NEUTROABS 5.7 10/28/2014   HGB 13.3 10/28/2014   HCT 39.2 10/28/2014   MCV 94.2 10/28/2014   PLT 215 10/28/2014      Chemistry      Component Value Date/Time   NA 139 10/28/2014 0826   NA 137 09/30/2014 1131   K 4.4 10/28/2014 0826   K 4.0 09/30/2014 1131   CL 97 09/30/2014 1131   CO2 32* 10/28/2014 0826   CO2 30 09/30/2014 1131   BUN 15.9 10/28/2014 0826   BUN 14 09/30/2014 1131   CREATININE 0.8  10/28/2014 0826   CREATININE 0.71 09/30/2014 1131      Component Value Date/Time   CALCIUM 9.3 10/28/2014 0826   CALCIUM 9.0 09/30/2014 1131   ALKPHOS 52 10/28/2014 0826   ALKPHOS 41 09/30/2014 1131   AST 11 10/28/2014 0826   AST 14 09/30/2014 1131   ALT 10 10/28/2014 0826   ALT 9 09/30/2014 1131   BILITOT 0.37 10/28/2014 0826   BILITOT 0.4 09/30/2014 1131       No results found for: LABCA2  No components found for: LABCA125  No results for input(s): INR in the last 168 hours.  Urinalysis    Component Value Date/Time   COLORURINE YELLOW 09/30/2014 [place] 09/30/2014 1131   LABSPEC 1.017 09/30/2014 1131   PHURINE 6.5 09/30/2014 1131   GLUCOSEU NEG 09/30/2014 1131   HGBUR NEG 09/30/2014 1131   BILIRUBINUR NEG 09/30/2014 1131   KETONESUR NEG 09/30/2014 1131   PROTEINUR NEG 09/30/2014 1131   UROBILINOGEN 0.2 09/30/2014 1131   NITRITE NEG 09/30/2014 1131   LEUKOCYTESUR NEG 09/30/2014 1131    STUDIES: No results found.  ASSESSMENT: 54 y.o. [place] woman status post right upper outer quadrant breast biopsy 05/04/2014 for a clinical T2 N0, stage IIA invasive ductal carcinoma, grade 2, estrogen and progesterone receptor positive, HER-2 not amplified, with an MIB-1 of 15%  (1) Oncotype score of 19 predicts an out of the breast recurrence within 10 years of 12% if the patient's only systemic therapy is tamoxifen for 5 years. It also predicts an additional 3-4% risk reduction with CAF chemotherapy  (2) the patient opted against adjuvant chemotherapy  (3) started neoadjuvant tamoxifen 06/04/2014  PLAN: Brandi Chen has been on tamoxifen now approximately 5 months. There may have been some slight shrinkage but I would not anticipate much more shrinkage at this point. I think if she has a lumpectomy she will have to have the nipple areolar complex removed. She probably will end up with a mastectomy. I am setting her up for a mammogram and ultrasound of the right  breast within the next week so that Dr. Barry Dienes can have some information regarding surgical planning. I do not think we can obtain an MRI given her insurance  situation  I would then anticipate she will have her surgery late this month or early next month. I am requesting an appointment with Dr. Pablo Ledger and radiation oncology second-half of February and to see [place] sometime late February or early March. Whenever she completes her radiation, assuming she receives radiation, she will go back on tamoxifen, which she was tolerating well.  We discussed the dog urine situation at her new job. 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 That will solve the problem and get her little bit more income. The pain she is having in her Sister due to the lack of exercise previously. She is now doing a little bit more stair climbing. This is actually a good thing. I reassured her that her submandibular lymph nodes are not going to be related to her breast cancer. There are more likely related to her thrush and I have written for Diflucan for her to take over the next 10 days. There are no interactions between Diflucan and Depakote that I could uncover. I am not sure why she has thrush: She is not on steroids and she was HIV negative when last tested October 2015  Brandi Chen has a good understanding of the overall plan. She agrees with it. She knows the goal of treatment in her case is cure. She will call with any problems that may develop before her next visit here.  Chauncey Cruel, MD   11/04/2014 10:51 AM

## 2014-11-04 NOTE — Telephone Encounter (Signed)
, °

## 2014-11-11 ENCOUNTER — Ambulatory Visit
Admission: RE | Admit: 2014-11-11 | Discharge: 2014-11-11 | Disposition: A | Discharge status: Home / Self Care | Payer: 59 | Source: Ambulatory Visit | Attending: Oncology | Admitting: Oncology

## 2014-11-11 ENCOUNTER — Ambulatory Visit
Admission: RE | Admit: 2014-11-11 | Discharge: 2014-11-11 | Disposition: A | Discharge status: Home / Self Care | Payer: 59 | Source: Ambulatory Visit | Attending: Nurse Practitioner | Admitting: Nurse Practitioner

## 2014-11-11 ENCOUNTER — Telehealth: Payer: Self-pay | Admitting: Certified Registered Nurse Anesthetist

## 2014-11-11 ENCOUNTER — Other Ambulatory Visit: Payer: Self-pay | Admitting: Nurse Practitioner

## 2014-11-11 DIAGNOSIS — C50411 Malignant neoplasm of upper-outer quadrant of right female breast: Secondary | ICD-10-CM

## 2014-11-11 NOTE — Telephone Encounter (Signed)
Message left on Dr. Gerarda Fraction nurse voice message.  Pt. stated that she thought Dr. Jannifer Rodney had told her she would have an U/s of the breast along with mammogram.  Pt. Is able to move her mammogram appt. From tomorrow to today due to weather.  I verified in Dr. Virgie Dad note on 11/04/2014 that he plans to do both.  Called to [facility] and u/s added for today.  Order will be sent to Susanne Borders NP to obtain signature.  I notified Heather to sign order as it comes in before 1:40pm(pt's scheduled time).  [place]le Ms. Piascik back and she is aware of above. HL

## 2014-11-26 ENCOUNTER — Other Ambulatory Visit (INDEPENDENT_AMBULATORY_CARE_PROVIDER_SITE_OTHER): Payer: Self-pay | Admitting: General Surgery

## 2014-11-26 DIAGNOSIS — C50911 Malignant neoplasm of unspecified site of right female breast: Secondary | ICD-10-CM

## 2014-11-28 ENCOUNTER — Other Ambulatory Visit: Payer: Self-pay | Admitting: Oncology

## 2014-11-29 ENCOUNTER — Telehealth: Payer: Self-pay | Admitting: *Deleted

## 2014-11-29 NOTE — Telephone Encounter (Signed)
Spoke with patient regarding appointment with Dr. Pablo Ledger.  Brandi Chen says she is to have a lumpectomy in near future.  Spoke with triage nurse at [facility] who checked with Dr. Barry Dienes.  [facility] will contact me when patient is ready to schedule rad onc consult.

## 2014-12-02 ENCOUNTER — Ambulatory Visit: Payer: 59 | Admitting: Radiation Oncology

## 2014-12-02 ENCOUNTER — Encounter (HOSPITAL_BASED_OUTPATIENT_CLINIC_OR_DEPARTMENT_OTHER): Payer: Self-pay | Admitting: *Deleted

## 2014-12-02 ENCOUNTER — Ambulatory Visit: Payer: 59

## 2014-12-02 NOTE — Progress Notes (Signed)
Pt lives alone-divorced-very depressed-she has had a cold, but not taking much for it-no fever-suggested some antihistamines-cough meds-preop teaching done

## 2014-12-08 ENCOUNTER — Telehealth: Payer: Self-pay | Admitting: *Deleted

## 2014-12-08 NOTE — Telephone Encounter (Signed)
Patient called reporting the lumpectomy for tomorrow will be rescheduled for the last week of February or the first week in March.  Cold since 11-24-2014 which she is getting over with no fever or swollen lymph nodes.  Has mucous, runny nose and trouble breathing through nose so anesthesiologist has cancelled procedure again.  Asked if she should restart tamoxifen or keep the 12-28-2014 appointment with Dr. Jana Hakim.  Will notify radiation and Dr. Jana Hakim.  Asked that she call with new procedure date to help with scheduling at [facility].  This nurse advised not to resume Tamoxifen before the procedure and she will not need to resume Tamoxifen until a few months after RT.

## 2014-12-09 ENCOUNTER — Encounter (HOSPITAL_COMMUNITY): Admission: RE | Admit: 2014-12-09 | Payer: 59 | Source: Ambulatory Visit

## 2014-12-10 NOTE — Telephone Encounter (Signed)
None

## 2014-12-16 NOTE — Progress Notes (Signed)
Cold all gone-feeling much better

## 2014-12-21 ENCOUNTER — Encounter (HOSPITAL_BASED_OUTPATIENT_CLINIC_OR_DEPARTMENT_OTHER)
Admission: RE | Disposition: A | Discharge status: Home / Self Care | Payer: Self-pay | Source: Ambulatory Visit | Attending: General Surgery

## 2014-12-21 ENCOUNTER — Ambulatory Visit (HOSPITAL_BASED_OUTPATIENT_CLINIC_OR_DEPARTMENT_OTHER): Payer: 59 | Admitting: Anesthesiology

## 2014-12-21 ENCOUNTER — Ambulatory Visit (HOSPITAL_COMMUNITY)
Admission: RE | Admit: 2014-12-21 | Discharge: 2014-12-21 | Disposition: A | Discharge status: Home / Self Care | Payer: 59 | Source: Ambulatory Visit | Attending: General Surgery | Admitting: General Surgery

## 2014-12-21 ENCOUNTER — Ambulatory Visit (HOSPITAL_BASED_OUTPATIENT_CLINIC_OR_DEPARTMENT_OTHER)
Admission: RE | Admit: 2014-12-21 | Discharge: 2014-12-21 | Disposition: A | Discharge status: Home / Self Care | Payer: 59 | Source: Ambulatory Visit | Attending: General Surgery | Admitting: General Surgery

## 2014-12-21 ENCOUNTER — Encounter (HOSPITAL_BASED_OUTPATIENT_CLINIC_OR_DEPARTMENT_OTHER): Payer: Self-pay | Admitting: *Deleted

## 2014-12-21 DIAGNOSIS — Z85820 Personal history of malignant melanoma of skin: Secondary | ICD-10-CM | POA: Insufficient documentation

## 2014-12-21 DIAGNOSIS — C50411 Malignant neoplasm of upper-outer quadrant of right female breast: Secondary | ICD-10-CM | POA: Insufficient documentation

## 2014-12-21 DIAGNOSIS — F419 Anxiety disorder, unspecified: Secondary | ICD-10-CM | POA: Diagnosis not present

## 2014-12-21 DIAGNOSIS — F329 Major depressive disorder, single episode, unspecified: Secondary | ICD-10-CM | POA: Insufficient documentation

## 2014-12-21 DIAGNOSIS — C50911 Malignant neoplasm of unspecified site of right female breast: Secondary | ICD-10-CM

## 2014-12-21 DIAGNOSIS — Z888 Allergy status to other drugs, medicaments and biological substances status: Secondary | ICD-10-CM | POA: Diagnosis not present

## 2014-12-21 HISTORY — PX: BREAST LUMPECTOMY WITH AXILLARY LYMPH NODE BIOPSY: SHX5593

## 2014-12-21 HISTORY — DX: Adverse effect of unspecified anesthetic, initial encounter: T41.45XA

## 2014-12-21 HISTORY — DX: Presence of spectacles and contact lenses: Z97.3

## 2014-12-21 HISTORY — DX: Other complications of anesthesia, initial encounter: T88.59XA

## 2014-12-21 LAB — POCT HEMOGLOBIN-HEMACUE: Hemoglobin: 14 g/dL (ref 12.0–15.0)

## 2014-12-21 SURGERY — BREAST LUMPECTOMY WITH AXILLARY LYMPH NODE BIOPSY
Anesthesia: Regional | Site: Breast | Laterality: Right

## 2014-12-21 MED ORDER — SUCCINYLCHOLINE CHLORIDE 20 MG/ML IJ SOLN
INTRAMUSCULAR | Status: AC
Start: 2014-12-21 — End: 2014-12-21
  Filled 2014-12-21: qty 1

## 2014-12-21 MED ORDER — PROMETHAZINE HCL 25 MG/ML IJ SOLN: 6.2500 mg | INTRAMUSCULAR | Status: DC | PRN

## 2014-12-21 MED ORDER — BUPIVACAINE-EPINEPHRINE (PF) 0.5% -1:200000 IJ SOLN
INTRAMUSCULAR | Status: DC | PRN
  Administered 2014-12-21: 30 mL

## 2014-12-21 MED ORDER — HYDROMORPHONE HCL 1 MG/ML IJ SOLN
INTRAMUSCULAR | Status: AC
  Filled 2014-12-21: qty 1

## 2014-12-21 MED ORDER — DEXAMETHASONE SODIUM PHOSPHATE 4 MG/ML IJ SOLN
INTRAMUSCULAR | Status: DC | PRN
Start: 2014-12-21 — End: 2014-12-21
  Administered 2014-12-21: 10 mg via INTRAVENOUS

## 2014-12-21 MED ORDER — OXYCODONE HCL 5 MG/5ML PO SOLN: 5.0000 mg | Freq: Once | ORAL | Status: DC | PRN

## 2014-12-21 MED ORDER — MIDAZOLAM HCL 2 MG/2ML IJ SOLN
1.0000 mg | INTRAMUSCULAR | Status: DC | PRN
  Administered 2014-12-21: 2 mg via INTRAVENOUS

## 2014-12-21 MED ORDER — FENTANYL CITRATE 0.05 MG/ML IJ SOLN
INTRAMUSCULAR | Status: DC | PRN
  Administered 2014-12-21: 50 ug via INTRAVENOUS
  Administered 2014-12-21 (×2): 25 ug via INTRAVENOUS

## 2014-12-21 MED ORDER — PROPOFOL 10 MG/ML IV EMUL
INTRAVENOUS | Status: AC
  Filled 2014-12-21: qty 50

## 2014-12-21 MED ORDER — MIDAZOLAM HCL 5 MG/5ML IJ SOLN
INTRAMUSCULAR | Status: DC | PRN
  Administered 2014-12-21 (×2): 1 mg via INTRAVENOUS

## 2014-12-21 MED ORDER — EPHEDRINE SULFATE 50 MG/ML IJ SOLN
INTRAMUSCULAR | Status: DC | PRN
  Administered 2014-12-21: 10 mg via INTRAVENOUS

## 2014-12-21 MED ORDER — MIDAZOLAM HCL 2 MG/2ML IJ SOLN
INTRAMUSCULAR | Status: AC
  Filled 2014-12-21: qty 2

## 2014-12-21 MED ORDER — FENTANYL CITRATE 0.05 MG/ML IJ SOLN
50.0000 ug | INTRAMUSCULAR | Status: DC | PRN
  Administered 2014-12-21: 100 ug via INTRAVENOUS

## 2014-12-21 MED ORDER — OXYCODONE HCL 5 MG PO TABS: 5.0000 mg | ORAL_TABLET | Freq: Once | ORAL | Status: DC | PRN

## 2014-12-21 MED ORDER — TECHNETIUM TC 99M SULFUR COLLOID FILTERED
1.0000 | Freq: Once | INTRAVENOUS | Status: AC | PRN
  Administered 2014-12-21: 1 via INTRADERMAL

## 2014-12-21 MED ORDER — SODIUM CHLORIDE 0.9 % IJ SOLN
INTRAMUSCULAR | Status: AC
  Filled 2014-12-21: qty 10

## 2014-12-21 MED ORDER — CEFAZOLIN SODIUM-DEXTROSE 2-3 GM-% IV SOLR
2.0000 g | INTRAVENOUS | Status: AC
  Administered 2014-12-21: 2 g via INTRAVENOUS

## 2014-12-21 MED ORDER — CEFAZOLIN SODIUM-DEXTROSE 2-3 GM-% IV SOLR
INTRAVENOUS | Status: AC
  Filled 2014-12-21: qty 50

## 2014-12-21 MED ORDER — OXYCODONE-ACETAMINOPHEN 5-325 MG PO TABS: 1.0000 | ORAL_TABLET | ORAL | Status: DC | PRN

## 2014-12-21 MED ORDER — FENTANYL CITRATE 0.05 MG/ML IJ SOLN
INTRAMUSCULAR | Status: AC
  Filled 2014-12-21: qty 6

## 2014-12-21 MED ORDER — PROPOFOL 10 MG/ML IV BOLUS
INTRAVENOUS | Status: DC | PRN
  Administered 2014-12-21: 150 mg via INTRAVENOUS

## 2014-12-21 MED ORDER — METHYLENE BLUE 1 % INJ SOLN
INTRAMUSCULAR | Status: AC
  Filled 2014-12-21: qty 10

## 2014-12-21 MED ORDER — LIDOCAINE-EPINEPHRINE (PF) 1 %-1:200000 IJ SOLN
INTRAMUSCULAR | Status: AC
  Filled 2014-12-21: qty 10

## 2014-12-21 MED ORDER — HYDROMORPHONE HCL 1 MG/ML IJ SOLN
0.2500 mg | INTRAMUSCULAR | Status: DC | PRN
  Administered 2014-12-21 (×3): 0.5 mg via INTRAVENOUS

## 2014-12-21 MED ORDER — BUPIVACAINE HCL (PF) 0.25 % IJ SOLN
INTRAMUSCULAR | Status: AC
  Filled 2014-12-21: qty 30

## 2014-12-21 MED ORDER — LACTATED RINGERS IV SOLN
INTRAVENOUS | Status: DC
  Administered 2014-12-21: 11:00:00 via INTRAVENOUS
  Administered 2014-12-21: 10 mL/h via INTRAVENOUS
  Administered 2014-12-21 (×2): via INTRAVENOUS

## 2014-12-21 MED ORDER — ONDANSETRON HCL 4 MG/2ML IJ SOLN
INTRAMUSCULAR | Status: DC | PRN
  Administered 2014-12-21: 4 mg via INTRAVENOUS

## 2014-12-21 MED ORDER — KETOROLAC TROMETHAMINE 30 MG/ML IJ SOLN: 30.0000 mg | Freq: Once | INTRAMUSCULAR | Status: DC | PRN

## 2014-12-21 MED ORDER — SODIUM CHLORIDE 0.9 % IJ SOLN
INTRAMUSCULAR | Status: DC | PRN
  Administered 2014-12-21: 5 mL

## 2014-12-21 MED ORDER — FENTANYL CITRATE 0.05 MG/ML IJ SOLN
INTRAMUSCULAR | Status: AC
  Filled 2014-12-21: qty 2

## 2014-12-21 MED ORDER — LIDOCAINE HCL (CARDIAC) 20 MG/ML IV SOLN: INTRAVENOUS | Status: DC | PRN

## 2014-12-21 SURGICAL SUPPLY — 67 items
BINDER BREAST LRG (GAUZE/BANDAGES/DRESSINGS) IMPLANT
BINDER BREAST MEDIUM (GAUZE/BANDAGES/DRESSINGS) ×3 IMPLANT
BINDER BREAST XLRG (GAUZE/BANDAGES/DRESSINGS) IMPLANT
BINDER BREAST XXLRG (GAUZE/BANDAGES/DRESSINGS) IMPLANT
BLADE HEX COATED 2.75 (ELECTRODE) ×3 IMPLANT
BLADE SURG 10 STRL SS (BLADE) ×3 IMPLANT
BLADE SURG 15 STRL LF DISP TIS (BLADE) ×1 IMPLANT
BLADE SURG 15 STRL SS (BLADE) ×2
BNDG COHESIVE 4X5 TAN STRL (GAUZE/BANDAGES/DRESSINGS) ×3 IMPLANT
BNDG GAUZE ELAST 4 BULKY (GAUZE/BANDAGES/DRESSINGS) ×3 IMPLANT
CANISTER SUCT 1200ML W/VALVE (MISCELLANEOUS) ×3 IMPLANT
CHLORAPREP W/TINT 26ML (MISCELLANEOUS) ×3 IMPLANT
CLIP TI LARGE 6 (CLIP) ×3 IMPLANT
CLIP TI MEDIUM 6 (CLIP) ×3 IMPLANT
CLIP TI WIDE RED SMALL 6 (CLIP) IMPLANT
CLOSURE WOUND 1/2 X4 (GAUZE/BANDAGES/DRESSINGS) ×1
COVER MAYO STAND STRL (DRAPES) ×3 IMPLANT
COVER PROBE W GEL 5X96 (DRAPES) ×3 IMPLANT
DECANTER SPIKE VIAL GLASS SM (MISCELLANEOUS) IMPLANT
DEVICE DUBIN W/COMP PLATE 8390 (MISCELLANEOUS) IMPLANT
DRAIN CHANNEL 19F RND (DRAIN) IMPLANT
DRAPE UTILITY XL STRL (DRAPES) ×6 IMPLANT
DRSG PAD ABDOMINAL 8X10 ST (GAUZE/BANDAGES/DRESSINGS) IMPLANT
DRSG TEGADERM 4X4.75 (GAUZE/BANDAGES/DRESSINGS) IMPLANT
ELECT REM PT RETURN 9FT ADLT (ELECTROSURGICAL) ×3
ELECTRODE REM PT RTRN 9FT ADLT (ELECTROSURGICAL) ×1 IMPLANT
EVACUATOR SILICONE 100CC (DRAIN) IMPLANT
GAUZE SPONGE 4X4 12PLY STRL (GAUZE/BANDAGES/DRESSINGS) IMPLANT
GLOVE BIO SURGEON STRL SZ 6 (GLOVE) ×3 IMPLANT
GLOVE BIOGEL PI IND STRL 6.5 (GLOVE) ×1 IMPLANT
GLOVE BIOGEL PI IND STRL 7.0 (GLOVE) ×1 IMPLANT
GLOVE BIOGEL PI IND STRL 8 (GLOVE) ×1 IMPLANT
GLOVE BIOGEL PI INDICATOR 6.5 (GLOVE) ×2
GLOVE BIOGEL PI INDICATOR 7.0 (GLOVE) ×2
GLOVE BIOGEL PI INDICATOR 8 (GLOVE) ×2
GLOVE ECLIPSE 6.5 STRL STRAW (GLOVE) ×3 IMPLANT
GLOVE SURG SS PI 8.0 STRL IVOR (GLOVE) ×3 IMPLANT
GOWN STRL REUS W/ TWL LRG LVL3 (GOWN DISPOSABLE) ×2 IMPLANT
GOWN STRL REUS W/TWL 2XL LVL3 (GOWN DISPOSABLE) ×3 IMPLANT
GOWN STRL REUS W/TWL LRG LVL3 (GOWN DISPOSABLE) ×4
NDL SAFETY ECLIPSE 18X1.5 (NEEDLE) ×1 IMPLANT
NEEDLE HYPO 18GX1.5 SHARP (NEEDLE) ×2
NEEDLE HYPO 25X1 1.5 SAFETY (NEEDLE) ×6 IMPLANT
NS IRRIG 1000ML POUR BTL (IV SOLUTION) ×3 IMPLANT
PACK BASIN DAY SURGERY FS (CUSTOM PROCEDURE TRAY) ×3 IMPLANT
PACK UNIVERSAL I (CUSTOM PROCEDURE TRAY) ×3 IMPLANT
PENCIL BUTTON HOLSTER BLD 10FT (ELECTRODE) ×3 IMPLANT
PIN SAFETY STERILE (MISCELLANEOUS) IMPLANT
SLEEVE SCD COMPRESS KNEE MED (MISCELLANEOUS) ×3 IMPLANT
SPONGE LAP 18X18 X RAY DECT (DISPOSABLE) ×3 IMPLANT
SPONGE LAP 4X18 X RAY DECT (DISPOSABLE) IMPLANT
STAPLER VISISTAT 35W (STAPLE) IMPLANT
STOCKINETTE IMPERVIOUS LG (DRAPES) ×3 IMPLANT
STRIP CLOSURE SKIN 1/2X4 (GAUZE/BANDAGES/DRESSINGS) ×2 IMPLANT
SUT MON AB 4-0 PC3 18 (SUTURE) ×3 IMPLANT
SUT SILK 2 0 SH (SUTURE) ×3 IMPLANT
SUT VIC AB 2-0 SH 27 (SUTURE) ×2
SUT VIC AB 2-0 SH 27XBRD (SUTURE) ×1 IMPLANT
SUT VIC AB 3-0 SH 27 (SUTURE) ×4
SUT VIC AB 3-0 SH 27X BRD (SUTURE) ×2 IMPLANT
SYR BULB 3OZ (MISCELLANEOUS) IMPLANT
SYR CONTROL 10ML LL (SYRINGE) ×6 IMPLANT
TOWEL OR 17X24 6PK STRL BLUE (TOWEL DISPOSABLE) ×3 IMPLANT
TOWEL OR NON WOVEN STRL DISP B (DISPOSABLE) ×3 IMPLANT
TUBE CONNECTING 20'X1/4 (TUBING) ×1
TUBE CONNECTING 20X1/4 (TUBING) ×2 IMPLANT
YANKAUER SUCT BULB TIP NO VENT (SUCTIONS) ×3 IMPLANT

## 2014-12-21 NOTE — Anesthesia Postprocedure Evaluation (Signed)
Anesthesia Post Note  Patient: Brandi Chen  Procedure(s) Performed: Procedure(s) (LRB): RIGHT BREAST LUMPECTOMY WITH AXILLARY LYMPH NODE BIOPSY (Right)  Anesthesia type: General  Patient location: PACU  Post pain: Pain level controlled and Adequate analgesia  Post assessment: Post-op Vital signs reviewed, Patient's Cardiovascular Status Stable, Respiratory Function Stable, Patent Airway and Pain level controlled  Last Vitals:  Filed Vitals:   12/21/14 1215  BP: 100/62  Pulse: 60  Temp:   Resp:     Post vital signs: Reviewed and stable  Level of consciousness: awake, alert  and oriented  Complications: No apparent anesthesia complications

## 2014-12-21 NOTE — Interval H&P Note (Signed)
History and Physical Interval Note:  12/21/2014 7:21 AM  Brandi Chen  has presented today for surgery, with the diagnosis of RIGHT BREAST CANCER  The various methods of treatment have been discussed with the patient and family. After consideration of risks, benefits and other options for treatment, the patient has consented to  Procedure(s): RIGHT BREAST LUMPECTOMY WITH AXILLARY LYMPH NODE BIOPSY (Right) as a surgical intervention .  The patient's history has been reviewed, patient examined, no change in status, stable for surgery.  I have reviewed the patient's chart and labs.  Questions were answered to the patient's satisfaction.     [name]

## 2014-12-21 NOTE — Transfer of Care (Signed)
Immediate Anesthesia Transfer of Care Note  Patient: Brandi Chen  Procedure(s) Performed: Procedure(s): RIGHT BREAST LUMPECTOMY WITH AXILLARY LYMPH NODE BIOPSY (Right)  Patient Location: PACU  Anesthesia Type:General and Regional  Level of Consciousness: awake and alert   Airway & Oxygen Therapy: Patient Spontanous Breathing and Patient connected to face mask oxygen  Post-op Assessment: Report given to RN and Post -op Vital signs reviewed and stable  Post vital signs: Reviewed and stable  Last Vitals:  Filed Vitals:   12/21/14 0735  BP:   Pulse: 66  Temp:   Resp: 19    Complications: No apparent anesthesia complications

## 2014-12-21 NOTE — Anesthesia Procedure Notes (Addendum)
Procedure Name: LMA Insertion Date/Time: 12/21/2014 7:52 AM Performed by: Melynda Ripple D Pre-anesthesia Checklist: Patient identified, Emergency Drugs available, Suction available and Patient being monitored Patient Re-evaluated:Patient Re-evaluated prior to inductionOxygen Delivery Method: Circle System Utilized Preoxygenation: Pre-oxygenation with 100% oxygen Intubation Type: IV induction Ventilation: Mask ventilation without difficulty LMA: LMA inserted LMA Size: 3.0 Number of attempts: 1 Airway Equipment and Method: Bite block Placement Confirmation: positive ETCO2 Tube secured with: Tape Dental Injury: Teeth and Oropharynx as per pre-operative assessment    Anesthesia Regional Block:  Pectoralis block  Pre-Anesthetic Checklist: ,, timeout performed, Correct Patient, Correct Site, Correct Laterality, Correct Procedure, Correct Position, site marked, Risks and benefits discussed,  Surgical consent,  Pre-op evaluation,  At surgeon's request and post-op pain management  Laterality: Right  Prep: chloraprep       Needles:   Needle Type: Echogenic Needle     Needle Length: 9cm 9 cm Needle Gauge: 21 and 21 G    Additional Needles:  Procedures: ultrasound guided (picture in chart) Pectoralis block Narrative:  Start time: 12/21/2014 7:20 AM End time: 12/21/2014 7:29 AM  Performed by: Personally  Anesthesiologist: Suzette Battiest E  Additional Notes: Risks and benefits explained to pt. Pt tolerated procedure without any immediate complications.

## 2014-12-21 NOTE — Progress Notes (Signed)
Assisted Dr. Rob Fitzgerald with right, ultrasound guided, pectoralis block. Side rails up, monitors on throughout procedure. See vital signs in flow sheet. Tolerated Procedure well. 

## 2014-12-21 NOTE — Anesthesia Preprocedure Evaluation (Addendum)
Anesthesia Evaluation  Patient identified by MRN, date of birth, ID band Patient awake    Reviewed: Allergy & Precautions, NPO status , Patient's Chart, lab work & pertinent test results  Airway Mallampati: III  TM Distance: >3 FB Neck ROM: Full    Dental  (+) Teeth Intact, Dental Advisory Given   Pulmonary neg pulmonary ROS,  breath sounds clear to auscultation        Cardiovascular negative cardio ROS  Rhythm:Regular Rate:Normal     Neuro/Psych negative neurological ROS     GI/Hepatic negative GI ROS, Neg liver ROS,   Endo/Other  negative endocrine ROS  Renal/GU negative Renal ROS     Musculoskeletal   Abdominal   Peds  Hematology negative hematology ROS (+)   Anesthesia Other Findings   Reproductive/Obstetrics                            Anesthesia Physical Anesthesia Plan  ASA: II  Anesthesia Plan: General and Regional   Post-op Pain Management:    Induction: Intravenous  Airway Management Planned: LMA  Additional Equipment:   Intra-op Plan:   Post-operative Plan: Extubation in OR  Informed Consent: I have reviewed the patients History and Physical, chart, labs and discussed the procedure including the risks, benefits and alternatives for the proposed anesthesia with the patient or authorized representative who has indicated his/her understanding and acceptance.     Plan Discussed with: CRNA  Anesthesia Plan Comments:         Anesthesia Quick Evaluation

## 2014-12-21 NOTE — Op Note (Signed)
Right Breast Lumpectomy with Sentinel Node Mapping and Biopsy Procedure Note  Indications: This patient presents with history of right breast cancer with clinically negative axillary lymph node exam.  Pre-operative Diagnosis: right breast cancer  Post-operative Diagnosis: right breast cancer  Surgeon: [name]   Anesthesia: General LMA anesthesia  ASA Class: 2  Procedure Details  The patient was seen in the Holding Room. The risks, benefits, complications, treatment options, and expected outcomes were discussed with the patient. The possibilities of reaction to medication, pulmonary aspiration, bleeding, infection, the need for additional procedures, failure to diagnose a condition, and creating a complication requiring transfusion or operation were discussed with the patient. The patient concurred with the proposed plan, giving informed consent.  The site of surgery properly noted/marked. The patient was taken to Operating Room # 6, identified as [name] and the procedure verified as Breast Lumpectomy and Sentinel Node Biopsy. A Time Out was held and the above information confirmed.  After induction of anesthesia, the right arm, breast, and chest were prepped and draped in standard fashion.     The lumpectomy was performed by creating an elliptical incision over the upper outer quadrant of the breast. Part of the areola was incorporated into the incision.  The dissection was performed with cautery.   Specimen radiography confirmed inclusion of the mammographic lesion.  Hemostasis was achieved with cautery.  Large clips were placed to mark the cavity edges.  Several deep 2-0 vicryl sutures were placed to close part of the cavity.  The wound was irrigated and closed with a 3-0 Vicryl deep dermal interrupted and a 4-0 Monocryl subcuticular closure in layers.    Using a hand-held gamma probe, axillary sentinel nodes were identified transcutaneously.  An oblique incision was created  below the axillary hairline.  Dissection was carried through the clavipectoral fascia.  2 level 2 axillary sentinel nodes were removed and submitted to pathology.  The axillary incision was closed with 3-0 vicryl deep dermal sutures and 4-0 monocryl subcuticular closure in layers.  Sterile dressings were applied. At the end of the operation, all sponge, instrument, and needle counts were correct.  Findings: grossly clear surgical margins and frozen section of skin medially was negative, margin assessment of posterior margin was negative.  SLN #1 hot, cps 45; SLN #2 palpable, felt like 2 nodes together.  Background cps 3.    Estimated Blood Loss:  Minimal         Specimens: right breast lumpectomy, right axillary SLN #1, Right axillary SLN #2                Complications:  None; patient tolerated the procedure well.         Disposition: PACU - hemodynamically stable.         Condition: stable

## 2014-12-21 NOTE — Discharge Instructions (Addendum)
[facility] °Office Phone Number [phone] ° °BREAST BIOPSY/ PARTIAL MASTECTOMY: POST OP INSTRUCTIONS ° °Always review your discharge instruction sheet given to you by the facility where your surgery was performed. ° °IF YOU HAVE DISABILITY OR FAMILY LEAVE FORMS, YOU MUST BRING THEM TO THE OFFICE FOR PROCESSING.  DO NOT GIVE THEM TO YOUR DOCTOR. ° °1. A prescription for pain medication may be given to you upon discharge.  Take your pain medication as prescribed, if needed.  If narcotic pain medicine is not needed, then you may take acetaminophen (Tylenol) or ibuprofen (Advil) as needed. °2. Take your usually prescribed medications unless otherwise directed °3. If you need a refill on your pain medication, please contact your pharmacy.  They will contact our office to request authorization.  Prescriptions will not be filled after 5pm or on week-ends. °4. You should eat very light the first 24 hours after surgery, such as soup, crackers, pudding, etc.  Resume your normal diet the day after surgery. °5. Most patients will experience some swelling and bruising in the breast.  Ice packs and a good support bra will help.  Swelling and bruising can take several days to resolve.  °6. It is common to experience some constipation if taking pain medication after surgery.  Increasing fluid intake and taking a stool softener will usually help or prevent this problem from occurring.  A mild laxative (Milk of Magnesia or Miralax) should be taken according to package directions if there are no bowel movements after 48 hours. °7. Unless discharge instructions indicate otherwise, you may remove your bandages 48 hours after surgery, and you may shower at that time.  You may have steri-strips (small skin tapes) in place directly over the incision.  These strips should be left on the skin for 7-10 days.   Any sutures or staples will be removed at the office during your follow-up visit. °8. ACTIVITIES:  You may resume  regular daily activities (gradually increasing) beginning the next day.  Wearing a good support bra or sports bra (or the breast binder) minimizes pain and swelling.  You may have sexual intercourse when it is comfortable. °a. You may drive when you no longer are taking prescription pain medication, you can comfortably wear a seatbelt, and you can safely maneuver your car and apply brakes. °b. RETURN TO WORK:  __________1 week_______________ °9. You should see your doctor in the office for a follow-up appointment approximately two weeks after your surgery.  Your doctor’s nurse will typically make your follow-up appointment when she calls you with your pathology report.  Expect your pathology report 2-3 business days after your surgery.  You may call to check if you do not hear from us after three days. ° ° °WHEN TO CALL YOUR DOCTOR: °1. Fever over 101.0 °2. Nausea and/or vomiting. °3. Extreme swelling or bruising. °4. Continued bleeding from incision. °5. Increased pain, redness, or drainage from the incision. ° °The clinic staff is available to answer your questions during regular business hours.  Please don’t hesitate to call and ask to speak to one of the nurses for clinical concerns.  If you have a medical emergency, go to the nearest emergency room or call 911.  A surgeon from [facility] is always on call at the hospital. ° °For further questions, please visit [email or web address]  ° ° °Post Anesthesia Home Care Instructions ° °Activity: °Get plenty of rest for the remainder of the day. A responsible adult should stay with you for 24   hours following the procedure.  °For the next 24 hours, DO NOT: °-Drive a car °-Operate machinery °-Drink alcoholic beverages °-Take any medication unless instructed by your physician °-Make any legal decisions or sign important papers. ° °Meals: °Start with liquid foods such as gelatin or soup. Progress to regular foods as tolerated. Avoid greasy, spicy, heavy  foods. If nausea and/or vomiting occur, drink only clear liquids until the nausea and/or vomiting subsides. Call your physician if vomiting continues. ° °Special Instructions/Symptoms: °Your throat may feel dry or sore from the anesthesia or the breathing tube placed in your throat during surgery. If this causes discomfort, gargle with warm salt water. The discomfort should disappear within 24 hours. ° ° °

## 2014-12-21 NOTE — H&P (Signed)
Brandi Chen 11/26/2014 12:13 PM Location: [facility] Patient #: [phone] DOB: [birth year] Divorced / Language: [name] / Race: White Female  History of Present Illness Brandi Klein MD; 11/26/2014 1:13 PM) Patient words: breast f/u.  The patient is a 54 year old female who presents with breast cancer. The patient is a 54 year old female who was diagnosed with breast cancer around 6 months ago. She had an Oncotype 19. 18 was cut off high risk. She did not want to undergo traditional chemotherapy and has subsequently been undergoing neoadjuvant tamoxifen. She has not had any significant shrinkage of the mass. She recently underwent repeat mammogram and ultrasound which continued to show a 2.6 cm mass. She does have some skin dimpling at the lateral border of the nipple. She does not have any significant breast pain. She is very discouraged by the lack of response to the tamoxifen.   Other Problems [name], CMA; 11/26/2014 12:13 PM) Anxiety Disorder Back Pain Depression Melanoma  Past Surgical History [name], CMA; 11/26/2014 12:13 PM) No pertinent past surgical history  Diagnostic Studies History [name], CMA; 11/26/2014 12:13 PM) Colonoscopy never Mammogram within last year Pap Smear 1-5 years ago  Allergies [name], CMA; 11/26/2014 12:14 PM) No Known Drug Allergies02/02/2015  Medication History [name], CMA; 11/26/2014 12:14 PM) Adderall XR (20MG  Capsule ER 24HR, Oral) Active. Diazepam (10MG  Tablet, Oral) Active. Divalproex Sodium ER (500MG  Tablet ER 24HR, Oral) Active. Fluconazole (100MG  Tablet, Oral) Active. MetroNIDAZOLE (0.75% Gel, Vaginal) Active. Tamoxifen Citrate (20MG  Tablet, Oral) Active. Prochlorperazine Maleate (10MG  Tablet, Oral) Active. Suprax (400MG  Capsule, Oral) Active.  Social History [name], CMA; 11/26/2014 12:13 PM) Alcohol use Moderate alcohol use. Caffeine use Coffee, Tea. Illicit drug use  Prefer to discuss with provider. Tobacco use Never smoker.  Family History [name], [name]; 11/26/2014 12:13 PM) Cervical Cancer Family Members In General. Heart Disease Father. Heart disease in female family member before age 43 Hypertension Father.  Pregnancy / Birth History [name], [name]; 11/26/2014 12:13 PM) Age at menarche 72 years. Age of menopause 47-50 Contraceptive History Depo-provera, Oral contraceptives. Gravida 0 Para 0  Review of Systems ([name]; 11/26/2014 12:13 PM) General Present- Appetite Loss and Fatigue. Not Present- Chills, Fever, Night Sweats, Weight Gain and Weight Loss. Skin Not Present- Change in Wart/Mole, Dryness, Hives, Jaundice, New Lesions, Non-Healing Wounds, Rash and Ulcer. HEENT Present- Nose Bleed and Wears glasses/contact lenses. Not Present- Earache, Hearing Loss, Hoarseness, Oral Ulcers, Ringing in the Ears, Seasonal Allergies, Sinus Pain, Sore Throat, Visual Disturbances and Yellow Eyes. Respiratory Not Present- Bloody sputum, Chronic Cough, Difficulty Breathing, Snoring and Wheezing. Cardiovascular Present- Leg Cramps. Not Present- Chest Pain, Difficulty Breathing Lying Down, Palpitations, Rapid Heart Rate, Shortness of Breath and Swelling of Extremities. Gastrointestinal Present- Change in Bowel Habits, Constipation and Nausea. Not Present- Abdominal Pain, Bloating, Bloody Stool, Chronic diarrhea, Difficulty Swallowing, Excessive gas, Gets full quickly at meals, Hemorrhoids, Indigestion, Rectal Pain and Vomiting. Female Genitourinary Not Present- Frequency, Nocturia, Painful Urination, Pelvic Pain and Urgency. Musculoskeletal Present- Joint Pain, Muscle Pain and Muscle Weakness. Not Present- Back Pain, Joint Stiffness and Swelling of Extremities. Neurological Present- Decreased Memory and Weakness. Not Present- Fainting, Headaches, Numbness, Seizures, Tingling, Tremor and Trouble walking. Psychiatric Present- Anxiety, Change in Sleep  Pattern and Depression. Not Present- Bipolar, Fearful and Frequent crying. Endocrine Not Present- Cold Intolerance, Excessive Hunger, Hair Changes, Heat Intolerance, Hot flashes and New Diabetes. Hematology Present- Gland problems. Not Present- Easy Bruising, Excessive bleeding, HIV and Persistent Infections.  Vitals ([name] CMA; 11/26/2014 12:14 PM) 11/26/2014 12:13 PM Weight: 152 lb Height: 65in Body Surface Area: 1.78 m Body Mass Index: 25.29 kg/m Temp.: 28F(Temporal)  Pulse: 76 (Regular)  BP: 124/80 (Sitting, Left Arm, Standard)    Physical Exam Brandi Klein MD; 11/26/2014 1:14 PM) General Mental Status-Alert. General Appearance-Consistent with stated age. Hydration-Well hydrated. Voice-Normal.  Head and Neck Head-normocephalic, atraumatic with no lesions or palpable masses.  Eye Sclera/Conjunctiva - Bilateral-No scleral icterus.  Chest and Lung Exam Chest and lung exam reveals -quiet, even and easy respiratory effort with no use of accessory muscles. Inspection Chest Wall - Normal. Back - normal.  Breast Note: Right breast has what feels like 2 x 3 cm mass in the upper outer quadrant right at the lateral border of the areola. There is dimpling at 10:00 in the areola. There is also a small bit of tethering at 9:00 of the skin lateral to the nipple. This mass is mobile. There is no palpable lymphadenopathy. The other breast is without abnormality.   Cardiovascular Cardiovascular examination reveals -normal pedal pulses bilaterally. Note: regular rate and rhythm  Abdomen Inspection-Inspection Normal. Palpation/Percussion Palpation and Percussion of the abdomen reveal - Soft, Non Tender, No Rebound tenderness, No Rigidity (guarding) and No hepatosplenomegaly. Auscultation Auscultation of the abdomen reveals - Bowel sounds normal.  Peripheral Vascular Upper Extremity Inspection - Bilateral - Normal - No Clubbing, No Cyanosis, No  Edema, Pulses Intact. Lower Extremity Palpation - Edema - Bilateral - No edema.  Neurologic Neurologic evaluation reveals -alert and oriented x 3 with no impairment of recent or remote memory. Mental Status-Normal.  Musculoskeletal Global Assessment -Note: no gross deformities.  Normal Exam - Left-Upper Extremity Strength Normal and Lower Extremity Strength Normal. Normal Exam - Right-Upper Extremity Strength Normal and Lower Extremity Strength Normal.  Lymphatic Head & Neck  General Head & Neck Lymphatics: Bilateral - Description - Normal. Axillary  General Axillary Region: Bilateral - Description - Normal. Tenderness - Non Tender.    Assessment & Plan Brandi Klein MD; 11/26/2014 1:15 PM) PRIMARY CANCER OF UPPER OUTER QUADRANT OF RIGHT FEMALE BREAST (174.4  C50.411) Impression: We will go ahead and set her up for a lumpectomy with sentinel lymph node biopsy. I advised her that I don't think there is a great chance that we'll be able to save the nipple. I did tell her I'm willing to take the skin that is tethered and send it for frozen section. If this is negative we may be able to save a portion of her nipple. I discussed that she may have positive margins and may have to go back for additional surgery. I also discussed a sentinel lymph node biopsy including the technique and the rationale. I reviewed the postoperative restrictions and expected recovery time. The patient understands and wishes to proceed.  25 months were spent in evaluation, examination, and counseling, and coordination of care. Greater than 50% of this was spent in counseling. Current Plans  Schedule for Surgery Pt Education - [facility] Breast Biopsy Instructions (FLB): discussed with patient and provided information.   Signed by Brandi Klein, MD (11/26/2014 1:16 PM)

## 2014-12-22 ENCOUNTER — Encounter (HOSPITAL_BASED_OUTPATIENT_CLINIC_OR_DEPARTMENT_OTHER): Payer: Self-pay | Admitting: General Surgery

## 2014-12-24 ENCOUNTER — Other Ambulatory Visit: Payer: Self-pay | Admitting: *Deleted

## 2014-12-24 NOTE — Progress Notes (Signed)
Quick Note:  Please let the patient know that her lymph nodes are negative. There is a question about the margin that I am reviewing with pathology because the report is not clear with respect to the location of the portion of skin that I took out. ______

## 2014-12-25 ENCOUNTER — Other Ambulatory Visit: Payer: Self-pay | Admitting: Oncology

## 2014-12-27 ENCOUNTER — Telehealth: Payer: Self-pay | Admitting: *Deleted

## 2014-12-27 ENCOUNTER — Other Ambulatory Visit: Payer: Self-pay | Admitting: *Deleted

## 2014-12-27 ENCOUNTER — Telehealth: Payer: Self-pay | Admitting: Oncology

## 2014-12-27 ENCOUNTER — Other Ambulatory Visit: Payer: Self-pay | Admitting: Oncology

## 2014-12-27 NOTE — Telephone Encounter (Signed)
PT. IS RECEIVING CALLS CONCERNING THE 12/28/14 APPOINTMENT WITH DR.Kermit CANCELLED. PT. SEES DR.WENTWORTH ON 01/06/15. SHE WOULD LIKE A RETURN CALL CONCERNING WHEN SHE NEEDS TO SEE DR.MAGRINAT. THIS NOTE WAS ROUTED TO DR.MAGRINAT AND VAL DODD,RN.

## 2014-12-27 NOTE — Telephone Encounter (Signed)
NO ENTRY 

## 2014-12-27 NOTE — Telephone Encounter (Signed)
VAL DODD,RN WILL SPEAK WITH PT. 

## 2014-12-27 NOTE — Telephone Encounter (Signed)
This RN spoke with pt per stating she would like to cancel appointment tomorrow ;.  " I do not see why I need to see the chemo doctor unless there is something more with my pathology that I do not know that would mean more chemo "  " I am already scheduled to see Brandi Chen on the 17th to discuss my radiation therapy "  " I am still waiting to hear from Brandi Chen about my test results "  Note pt stated several times " unsure why I can't just cancel my appointment with Brandi Chen "  This RN reiterated to pt appointment with the medical oncologist is often made for overall questions and plan of care- Brandi Chen verified she doesn't have any additional questions " unless something my pathology didn't come back good "  Pt states she is waiting to hear from Brandi Chen today.  Appointment for tomorrow's appointment will be canceled per pt's request and appointment will be planned for end of radiation for pt to restart antiestrogen.  No other questions or needs at this time.

## 2014-12-27 NOTE — Telephone Encounter (Signed)
Called and lm with new appt per pof  anne

## 2014-12-28 ENCOUNTER — Ambulatory Visit: Payer: 59 | Admitting: Oncology

## 2014-12-29 ENCOUNTER — Other Ambulatory Visit: Payer: Self-pay | Admitting: Oncology

## 2014-12-31 NOTE — Progress Notes (Signed)
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 - LYMPHOVASCULAR INVASION IS IDENTIFIED.- SEE ONCOLOGY TABLE BELOW. 2. Lymph node, sentinel, biopsy, Right axillary #1- THERE IS NO EVIDENCE OF CARCINOMA IN 1 OF 1 LYMPH NODE (0/1). 3. Lymph node, sentinel, biopsy, Right axillary #2- THERE IS NO EVIDENCE OF CARCINOMA IN 1 OF 1 LYMPH NODE (0/1).  Receptor Status: ER(+), PR (+), Her2-neu (-)  Did patient present with symptoms (if so, please note symptoms) or was this found on screening mammography?:presented with a palpable right breast ,contour was different,mass  Past/Anticipated interventions by surgeon, if any: lumpectomy on 12-21-14  Past/Anticipated interventions by medical oncology, if any: Chemotherapy was refused. Stated tamoxifen on 06/04/14. Currently is not taking Tamoxifen.    Lymphedema issues, if any: Reports no swelling in Right arm, reports numbness to right upper arm and swelling to right axilla incision.    Pain issues, if any: rating pain a 4 out of 10 to right axilla incision site.    SAFETY ISSUES:  Prior radiation? No  Pacemaker/ICD? No  Possible current pregnancy?no. Last menstrual period 08/27/2008.  Is the patient on methotrexate? no  Current Complaints / other details: Menarche age 42.GXP0. Menopause 4 years ago with no HRT.Took depo-provera for several years. Mother bladder cancer, maternal grandmother=ovarian/uterine cancer, Paternal Aunt =breast cancer  ADD, anxiety Noted crusting to right nipple post surgery and swelling to right axilla incision.      Arlyss Repress, RN 12/31/2014,11:56 AM

## 2015-01-05 ENCOUNTER — Encounter: Payer: Self-pay | Admitting: Radiation Oncology

## 2015-01-06 ENCOUNTER — Ambulatory Visit
Admission: RE | Admit: 2015-01-06 | Discharge: 2015-01-06 | Disposition: A | Discharge status: Home / Self Care | Payer: 59 | Source: Ambulatory Visit | Attending: Radiation Oncology | Admitting: Radiation Oncology

## 2015-01-06 ENCOUNTER — Encounter: Payer: Self-pay | Admitting: Radiation Oncology

## 2015-01-06 VITALS — BP 113/86 | HR 95 | Temp 98.1°F | Resp 12 | Wt 152.6 lb

## 2015-01-06 DIAGNOSIS — Z510 Encounter for antineoplastic radiation therapy: Secondary | ICD-10-CM | POA: Insufficient documentation

## 2015-01-06 DIAGNOSIS — Z170 Estrogen receptor positive status [ER+]: Secondary | ICD-10-CM | POA: Insufficient documentation

## 2015-01-06 DIAGNOSIS — Z9011 Acquired absence of right breast and nipple: Secondary | ICD-10-CM | POA: Insufficient documentation

## 2015-01-06 DIAGNOSIS — L598 Other specified disorders of the skin and subcutaneous tissue related to radiation: Secondary | ICD-10-CM | POA: Diagnosis not present

## 2015-01-06 DIAGNOSIS — C50411 Malignant neoplasm of upper-outer quadrant of right female breast: Secondary | ICD-10-CM | POA: Diagnosis not present

## 2015-01-06 DIAGNOSIS — Z79899 Other long term (current) drug therapy: Secondary | ICD-10-CM | POA: Diagnosis not present

## 2015-01-06 NOTE — Progress Notes (Signed)
   Department of Radiation Oncology  Phone:  [phone] Fax:        [phone]   Name: ARLO BUFFONE MRN: [id number]  DOB: 12-04-1960  Date: 01/06/2015  Follow Up Visit Note  Diagnosis: Breast cancer of upper-outer quadrant of right female breast   Staging form: Breast, AJCC 7th Edition     Clinical: Stage IIA (T2, N0, cM0) - Unsigned       Staging comments: Staged at breast conference 7.22.15      Pathologic stage from 12/22/2014: Stage IIA (yT2, N0, cM0) - Unsigned       Staging comments: Staged on final lumpectomy specimen by Dr. Lyndon Code.  Interval History: Jaedynn presents today for routine followup.  She elected for neoadjuvant tamoxifen and has been taking that since August.  She had a good response clinically and underwent a lumpectomy on 3/1. This showed a 2.5 cm invasive ductal carcinoma with lymphovascular invasion. 2 sentinel nodes were negative. The margin was close anteriorly but upon further review this was skin (per discussion in breast [place]). She would like to begin treatments but has an axillary seroma which has drained twice.  She is scheduled to see Dr. Barry Dienes again on Monday.   Physical Exam:  Filed Vitals:   01/06/15 0911  BP: 113/86  Pulse: 95  Temp: 98.1 F (36.7 C)  TempSrc: Oral  Resp: 12  Weight: 152 lb 9.6 oz (69.219 kg)  SpO2: 98%   Large palpable seroma in right axilla. No signs of infection. Incision is well healed  IMPRESSION: Torie is a 54 y.o. female s/p neoadjuvant hormonal treatment and now s/p lumpectomy  PLAN:  I spoke to the patient today regarding her diagnosis and options for treatment. We discussed the equivalence in terms of survival and local failure between mastectomy and breast conservation. We discussed the role of radiation in decreasing local failures in patients who undergo lumpectomy. We discussed the process of simulation and the placement tattoos. We discussed 4-6 weeks of treatment as an outpatient. We discussed the  possibility of asymptomatic lung damage. We discussed the low likelihood of secondary malignancies. We discussed the possible side effects including but not limited to skin redness, fatigue, permanent skin darkening, and breast swelling.   I asked her to call to schedule simulation when she has been cleared by Dr. Barry Dienes, hopefully with plans to start the first week of April. She signed informed consent.   Thea Silversmith, MD

## 2015-01-11 ENCOUNTER — Encounter: Payer: Self-pay | Admitting: *Deleted

## 2015-01-11 NOTE — Progress Notes (Signed)
[facility] Psychosocial Distress Screening Clinical Social Work  Clinical Social Work was referred by distress screening protocol.  The patient scored a 7 on the Psychosocial Distress Thermometer which indicates moderate distress. Clinical Social Worker contacted patient at home to assess for distress and other psychosocial needs.  Patient stated she recovering from surgery and having some issues with fluid, but was doing "ok".  CSW informed patient of CSW role and the support team.  Patient has reported insurance as a cause of distress.  Patient stated she had been connected with an "agent" through the [employer] who has helped her navigate the process, and now feel more comfortable.  CSW and patient discussed the support services available at [facility].  Patient expressed interest in the [place].  CSW encouraged patient to contact Raquel, financial advocate to determine if she qualifies for the grant.  CSW provided financial advocate contact information.  CSW provided additional support and encouraged patient to call with questions or concerns.     ONCBCN DISTRESS SCREENING 01/06/2015  Screening Type Initial Screening  Distress experienced in past week (1-10) 7  Practical problem type Insurance;Work/school;Food  Family Problem type   Emotional problem type Depression;Nervousness/Anxiety;Adjusting to illness;Isolation/feeling alone;Feeling hopeless;Adjusting to appearance changes  Spiritual/Religous concerns type Loss of Faith;Loss of sense of purpose  Information Concerns Type   Physical Problem type Pain;Nausea/vomiting;Loss of appetitie;Constipation/diarrhea;Swollen arms/legs;Other (comment)  Physician notified of physical symptoms Yes  Referral to clinical psychology Yes  Referral to clinical social work   Referral to dietition   Referral to financial advocate   Referral to support programs   Referral to palliative care   Other most distressing is insurance   Johnnye Lana, MSW, [place],  [facility] Clinical Social Worker [place] [phone]

## 2015-01-24 ENCOUNTER — Other Ambulatory Visit: Payer: Self-pay | Admitting: General Surgery

## 2015-01-24 DIAGNOSIS — C50411 Malignant neoplasm of upper-outer quadrant of right female breast: Secondary | ICD-10-CM

## 2015-01-25 ENCOUNTER — Other Ambulatory Visit: Payer: Self-pay | Admitting: General Surgery

## 2015-01-25 ENCOUNTER — Ambulatory Visit
Admission: RE | Admit: 2015-01-25 | Discharge: 2015-01-25 | Disposition: A | Discharge status: Home / Self Care | Payer: 59 | Source: Ambulatory Visit | Attending: General Surgery | Admitting: General Surgery

## 2015-01-25 DIAGNOSIS — C50411 Malignant neoplasm of upper-outer quadrant of right female breast: Secondary | ICD-10-CM

## 2015-01-26 ENCOUNTER — Telehealth: Payer: Self-pay

## 2015-01-26 NOTE — Telephone Encounter (Signed)
Patient called and states she has been released by surgeon to start radiation.Brandi Chen in simulation notified.Orders already in, she will schedule for simulation and call patient with appointment.

## 2015-02-01 ENCOUNTER — Ambulatory Visit
Admission: RE | Admit: 2015-02-01 | Discharge: 2015-02-01 | Disposition: A | Discharge status: Home / Self Care | Payer: 59 | Source: Ambulatory Visit | Attending: Radiation Oncology | Admitting: Radiation Oncology

## 2015-02-01 DIAGNOSIS — C50411 Malignant neoplasm of upper-outer quadrant of right female breast: Secondary | ICD-10-CM

## 2015-02-01 DIAGNOSIS — Z510 Encounter for antineoplastic radiation therapy: Secondary | ICD-10-CM | POA: Diagnosis not present

## 2015-02-01 NOTE — Progress Notes (Signed)
Name: Brandi Chen   MRN: [id number]  Date:  02/01/2015  DOB: Dec 23, 1960  Status:outpatient    DIAGNOSIS: Breast cancer.  CONSENT VERIFIED: yes   SET UP: Patient is setup supine   IMMOBILIZATION:  The following immobilization was used:Custom Moldable Pillow, breast board.   NARRATIVE: [name] was brought to the [facility].  Identity was confirmed.  All relevant records and images related to the planned course of therapy were reviewed.  Then, the patient was positioned in a stable reproducible clinical set-up for radiation therapy.  Wires were placed to delineate the clinical extent of breast tissue. A wire was placed on the scar as well.  CT images were obtained.  An isocenter was placed. Skin markings were placed.  The CT images were loaded into the planning software where the target and avoidance structures were contoured.  The radiation prescription was entered and confirmed. The patient was discharged in stable condition and tolerated simulation well.    TREATMENT PLANNING NOTE:  Treatment planning then occurred. I have requested : MLC's, isodose plan, basic dose calculation  I personally designed and supervised the construction of 3 medically necessary complex treatment devices for the protection of critical normal structures including the lungs and contralateral breast as well as the immobilization device which is necessary for set up certainty.   3D simulation occurred. I requested and analyzed a dose volume histogram of the heart, lungs and lumpectomy cavity.

## 2015-02-08 ENCOUNTER — Ambulatory Visit
Admission: RE | Admit: 2015-02-08 | Discharge: 2015-02-08 | Disposition: A | Discharge status: Home / Self Care | Payer: 59 | Source: Ambulatory Visit | Attending: Radiation Oncology | Admitting: Radiation Oncology

## 2015-02-08 DIAGNOSIS — Z510 Encounter for antineoplastic radiation therapy: Secondary | ICD-10-CM | POA: Diagnosis not present

## 2015-02-09 ENCOUNTER — Ambulatory Visit
Admission: RE | Admit: 2015-02-09 | Discharge: 2015-02-09 | Disposition: A | Discharge status: Home / Self Care | Payer: 59 | Source: Ambulatory Visit | Attending: Radiation Oncology | Admitting: Radiation Oncology

## 2015-02-09 DIAGNOSIS — Z510 Encounter for antineoplastic radiation therapy: Secondary | ICD-10-CM | POA: Diagnosis not present

## 2015-02-10 ENCOUNTER — Ambulatory Visit
Admission: RE | Admit: 2015-02-10 | Discharge: 2015-02-10 | Disposition: A | Discharge status: Home / Self Care | Payer: 59 | Source: Ambulatory Visit | Attending: Radiation Oncology | Admitting: Radiation Oncology

## 2015-02-10 ENCOUNTER — Ambulatory Visit: Admission: RE | Admit: 2015-02-10 | Payer: 59 | Source: Ambulatory Visit

## 2015-02-11 ENCOUNTER — Ambulatory Visit
Admission: RE | Admit: 2015-02-11 | Discharge: 2015-02-11 | Disposition: A | Discharge status: Home / Self Care | Payer: 59 | Source: Ambulatory Visit | Attending: Radiation Oncology | Admitting: Radiation Oncology

## 2015-02-11 DIAGNOSIS — Z510 Encounter for antineoplastic radiation therapy: Secondary | ICD-10-CM | POA: Diagnosis not present

## 2015-02-14 ENCOUNTER — Ambulatory Visit
Admission: RE | Admit: 2015-02-14 | Discharge: 2015-02-14 | Disposition: A | Discharge status: Home / Self Care | Payer: 59 | Source: Ambulatory Visit | Attending: Radiation Oncology | Admitting: Radiation Oncology

## 2015-02-14 DIAGNOSIS — Z510 Encounter for antineoplastic radiation therapy: Secondary | ICD-10-CM | POA: Diagnosis not present

## 2015-02-15 ENCOUNTER — Ambulatory Visit
Admission: RE | Admit: 2015-02-15 | Discharge: 2015-02-15 | Disposition: A | Discharge status: Home / Self Care | Payer: 59 | Source: Ambulatory Visit | Attending: Radiation Oncology | Admitting: Radiation Oncology

## 2015-02-15 VITALS — BP 115/76 | HR 97 | Temp 98.3°F | Wt 150.3 lb

## 2015-02-15 DIAGNOSIS — C50411 Malignant neoplasm of upper-outer quadrant of right female breast: Secondary | ICD-10-CM

## 2015-02-15 DIAGNOSIS — Z510 Encounter for antineoplastic radiation therapy: Secondary | ICD-10-CM | POA: Diagnosis not present

## 2015-02-15 NOTE — Progress Notes (Signed)
Weekly Management Note Current Dose: 10.68  Gy  Projected Dose: 52.72 Gy   Narrative:  The patient presents for routine under treatment assessment.  CBCT/MVCT images/Port film x-rays were reviewed.  The chart was checked. Doing well. No complaints. RN education performed.  Physical Findings: Weight: 150 lb 4.8 oz (68.176 kg). Unchanged  Impression:  The patient is tolerating radiation.  Plan:  Continue treatment as planned. Start radiaplex.

## 2015-02-15 NOTE — Progress Notes (Signed)
Pt here for patient teaching.  Pt given Radiation and You booklet, skin care instructions, Alra deodorant and Radiaplex gel. Reviewed areas of pertinence such as  . Pt able to give teach back of to pat skin, use unscented/gentle soap and drink plenty of water,apply Radiaplex bid, avoid applying anything to skin within 4 hours of treatment, avoid wearing an under wire bra and to use an electric razor if they must shave. Pt demonstrated understanding, needs reinforcement and verbalizes understanding of information given and will contact nursing with any questions or concerns.

## 2015-02-16 ENCOUNTER — Ambulatory Visit
Admission: RE | Admit: 2015-02-16 | Discharge: 2015-02-16 | Disposition: A | Discharge status: Home / Self Care | Payer: 59 | Source: Ambulatory Visit | Attending: Radiation Oncology | Admitting: Radiation Oncology

## 2015-02-16 DIAGNOSIS — Z510 Encounter for antineoplastic radiation therapy: Secondary | ICD-10-CM | POA: Diagnosis not present

## 2015-02-17 ENCOUNTER — Other Ambulatory Visit: Payer: Self-pay | Admitting: *Deleted

## 2015-02-17 ENCOUNTER — Telehealth: Payer: Self-pay

## 2015-02-17 ENCOUNTER — Ambulatory Visit
Admission: RE | Admit: 2015-02-17 | Discharge: 2015-02-17 | Disposition: A | Discharge status: Home / Self Care | Payer: 59 | Source: Ambulatory Visit | Attending: Radiation Oncology | Admitting: Radiation Oncology

## 2015-02-17 DIAGNOSIS — Z510 Encounter for antineoplastic radiation therapy: Secondary | ICD-10-CM | POA: Diagnosis not present

## 2015-02-17 NOTE — Telephone Encounter (Signed)
Patient inquired about having follow up with Dr.Magrinat after the completion of radiation.Spoke with Marlon Pel RN and she will take care of this and give patient a call.

## 2015-02-18 ENCOUNTER — Ambulatory Visit
Admission: RE | Admit: 2015-02-18 | Discharge: 2015-02-18 | Disposition: A | Discharge status: Home / Self Care | Payer: 59 | Source: Ambulatory Visit | Attending: Radiation Oncology | Admitting: Radiation Oncology

## 2015-02-18 DIAGNOSIS — Z510 Encounter for antineoplastic radiation therapy: Secondary | ICD-10-CM | POA: Diagnosis not present

## 2015-02-21 ENCOUNTER — Ambulatory Visit
Admission: RE | Admit: 2015-02-21 | Discharge: 2015-02-21 | Disposition: A | Discharge status: Home / Self Care | Payer: 59 | Source: Ambulatory Visit | Attending: Radiation Oncology | Admitting: Radiation Oncology

## 2015-02-21 DIAGNOSIS — Z510 Encounter for antineoplastic radiation therapy: Secondary | ICD-10-CM | POA: Diagnosis not present

## 2015-02-22 ENCOUNTER — Ambulatory Visit
Admission: RE | Admit: 2015-02-22 | Discharge: 2015-02-22 | Disposition: A | Discharge status: Home / Self Care | Payer: 59 | Source: Ambulatory Visit | Attending: Radiation Oncology | Admitting: Radiation Oncology

## 2015-02-22 VITALS — BP 107/76 | HR 85 | Temp 97.7°F | Resp 12 | Wt 152.3 lb

## 2015-02-22 DIAGNOSIS — C50411 Malignant neoplasm of upper-outer quadrant of right female breast: Secondary | ICD-10-CM

## 2015-02-22 DIAGNOSIS — Z510 Encounter for antineoplastic radiation therapy: Secondary | ICD-10-CM | POA: Diagnosis not present

## 2015-02-22 NOTE — Progress Notes (Signed)
Weekly Management Note Current Dose: 21.36  Gy  Projected Dose: 52.72 Gy   Narrative:  The patient presents for routine under treatment assessment.  CBCT/MVCT images/Port film x-rays were reviewed.  The chart was checked. Doing well. No complaints. Frustrated by registration process.   Physical Findings: Weight: 152 lb 4.8 oz (69.083 kg). Unchanged. Some red bumps along medial aspect of breast. Working.   Impression:  The patient is tolerating radiation.  Plan:  Continue treatment as planned. Continue radiaplex.

## 2015-02-22 NOTE — Progress Notes (Signed)
She is currently in no pain.  Pt right breast- positive for Pruritus, "pimple" like area over the sternum area.  Pt denies edema. Pt continues to apply Radiaplex and Neosporin as directed. BP 107/76 mmHg  Pulse 85  Temp(Src) 97.7 F (36.5 C) (Oral)  Resp 12  Wt 152 lb 4.8 oz (69.083 kg)  SpO2 99%  LMP 08/27/2008

## 2015-02-23 ENCOUNTER — Ambulatory Visit
Admission: RE | Admit: 2015-02-23 | Discharge: 2015-02-23 | Disposition: A | Discharge status: Home / Self Care | Payer: 59 | Source: Ambulatory Visit | Attending: Radiation Oncology | Admitting: Radiation Oncology

## 2015-02-23 DIAGNOSIS — Z510 Encounter for antineoplastic radiation therapy: Secondary | ICD-10-CM | POA: Diagnosis not present

## 2015-02-24 ENCOUNTER — Ambulatory Visit: Payer: 59

## 2015-02-24 ENCOUNTER — Ambulatory Visit: Admission: RE | Admit: 2015-02-24 | Payer: 59 | Source: Ambulatory Visit

## 2015-02-25 ENCOUNTER — Ambulatory Visit
Admission: RE | Admit: 2015-02-25 | Discharge: 2015-02-25 | Disposition: A | Discharge status: Home / Self Care | Payer: 59 | Source: Ambulatory Visit | Attending: Radiation Oncology | Admitting: Radiation Oncology

## 2015-02-25 DIAGNOSIS — Z510 Encounter for antineoplastic radiation therapy: Secondary | ICD-10-CM | POA: Diagnosis not present

## 2015-02-28 ENCOUNTER — Ambulatory Visit
Admission: RE | Admit: 2015-02-28 | Discharge: 2015-02-28 | Disposition: A | Discharge status: Home / Self Care | Payer: 59 | Source: Ambulatory Visit | Attending: Radiation Oncology | Admitting: Radiation Oncology

## 2015-02-28 DIAGNOSIS — Z510 Encounter for antineoplastic radiation therapy: Secondary | ICD-10-CM | POA: Diagnosis not present

## 2015-03-01 ENCOUNTER — Ambulatory Visit: Admission: RE | Admit: 2015-03-01 | Payer: 59 | Source: Ambulatory Visit | Admitting: Radiation Oncology

## 2015-03-01 ENCOUNTER — Ambulatory Visit
Admission: RE | Admit: 2015-03-01 | Discharge: 2015-03-01 | Disposition: A | Discharge status: Home / Self Care | Payer: 59 | Source: Ambulatory Visit | Attending: Radiation Oncology | Admitting: Radiation Oncology

## 2015-03-01 VITALS — BP 118/78 | HR 78 | Temp 97.8°F | Wt 151.4 lb

## 2015-03-01 DIAGNOSIS — C50411 Malignant neoplasm of upper-outer quadrant of right female breast: Secondary | ICD-10-CM

## 2015-03-01 DIAGNOSIS — Z510 Encounter for antineoplastic radiation therapy: Secondary | ICD-10-CM | POA: Diagnosis not present

## 2015-03-01 NOTE — Progress Notes (Signed)
Weekly assessment of radiation to right breast.Completed 13 of 21 treatments.Denies pain.Skin red with mild itching rash.Using hydrocortisone and radiaplex.Patient in good spirits today.

## 2015-03-01 NOTE — Progress Notes (Signed)
Weekly Management Note Current Dose: 34.71  Gy  Projected Dose: 52.72 Gy   Narrative:  The patient presents for routine under treatment assessment.  CBCT/MVCT images/Port film x-rays were reviewed.  The chart was checked. Doing well. Minimal itching.   Physical Findings: Weight: 151 lb 6.4 oz (68.675 kg). Pink skin   Impression:  The patient is tolerating radiation.  Plan:  Continue treatment as planned. Continue radiaplex. Add hydrocortisone.

## 2015-03-02 ENCOUNTER — Ambulatory Visit
Admission: RE | Admit: 2015-03-02 | Discharge: 2015-03-02 | Disposition: A | Discharge status: Home / Self Care | Payer: 59 | Source: Ambulatory Visit | Attending: Radiation Oncology | Admitting: Radiation Oncology

## 2015-03-02 ENCOUNTER — Ambulatory Visit (HOSPITAL_BASED_OUTPATIENT_CLINIC_OR_DEPARTMENT_OTHER): Payer: 59 | Admitting: Oncology

## 2015-03-02 ENCOUNTER — Other Ambulatory Visit (HOSPITAL_BASED_OUTPATIENT_CLINIC_OR_DEPARTMENT_OTHER): Payer: 59

## 2015-03-02 ENCOUNTER — Telehealth: Payer: Self-pay | Admitting: Oncology

## 2015-03-02 VITALS — BP 133/82 | HR 75 | Temp 97.5°F | Resp 18 | Ht 65.0 in | Wt 152.5 lb

## 2015-03-02 DIAGNOSIS — Z510 Encounter for antineoplastic radiation therapy: Secondary | ICD-10-CM | POA: Diagnosis not present

## 2015-03-02 DIAGNOSIS — C50411 Malignant neoplasm of upper-outer quadrant of right female breast: Secondary | ICD-10-CM

## 2015-03-02 DIAGNOSIS — Z170 Estrogen receptor positive status [ER+]: Secondary | ICD-10-CM

## 2015-03-02 LAB — COMPREHENSIVE METABOLIC PANEL (CC13)
ALT: 11 U/L (ref 0–55)
AST: 15 U/L (ref 5–34)
Albumin: 3.9 g/dL (ref 3.5–5.0)
Alkaline Phosphatase: 51 U/L (ref 40–150)
Anion Gap: 8 mEq/L (ref 3–11)
BILIRUBIN TOTAL: 0.42 mg/dL (ref 0.20–1.20)
BUN: 13.1 mg/dL (ref 7.0–26.0)
CO2: 30 mEq/L — ABNORMAL HIGH (ref 22–29)
Calcium: 8.9 mg/dL (ref 8.4–10.4)
Chloride: 97 mEq/L — ABNORMAL LOW (ref 98–109)
Creatinine: 0.7 mg/dL (ref 0.6–1.1)
EGFR: >90 mL/min/{1.73_m2} (ref >90)
GLUCOSE: 88 mg/dL (ref 70–140)
Potassium: 4.5 mEq/L (ref 3.5–5.1)
Sodium: 135 mEq/L — ABNORMAL LOW (ref 136–145)
TOTAL PROTEIN: 6.5 g/dL (ref 6.4–8.3)

## 2015-03-02 LAB — CBC WITH DIFFERENTIAL/PLATELET
BASO%: 0.4 % (ref 0.0–2.0)
BASOS ABS: 0 10*3/uL (ref 0.0–0.1)
EOS%: 0.8 % (ref 0.0–7.0)
Eosinophils Absolute: 0 10*3/uL (ref 0.0–0.5)
HEMATOCRIT: 40.5 % (ref 34.8–46.6)
HEMOGLOBIN: 13.7 g/dL (ref 11.6–15.9)
LYMPH%: 22.7 % (ref 14.0–49.7)
MCH: 31.5 pg (ref 25.1–34.0)
MCHC: 33.7 g/dL (ref 31.5–36.0)
MCV: 93.6 fL (ref 79.5–101.0)
MONO#: 0.6 10*3/uL (ref 0.1–0.9)
MONO%: 17.8 % — AB (ref 0.0–14.0)
NEUT%: 58.3 % (ref 38.4–76.8)
NEUTROS ABS: 2 10*3/uL (ref 1.5–6.5)
PLATELETS: 165 10*3/uL (ref 145–400)
RBC: 4.33 10*6/uL (ref 3.70–5.45)
RDW: 12.1 % (ref 11.2–14.5)
WBC: 3.4 10*3/uL — ABNORMAL LOW (ref 3.9–10.3)
lymph#: 0.8 10*3/uL — ABNORMAL LOW (ref 0.9–3.3)

## 2015-03-02 NOTE — Progress Notes (Signed)
[place]  [phone] [phone]     ID: Brandi Chen DOB: [birth year]  MR#: [id number]  YOV#:[id number]  Patient Care Team: Lona Kettle, MD as PCP - General (Family Medicine) [name], MD as Consulting Physician (General Surgery) Chauncey Cruel, MD as Consulting Physician (Oncology) Thea Silversmith, MD as Consulting Physician (Radiation Oncology) [name], MD as Consulting Physician (Gynecology)  CHIEF COMPLAINT: Newly diagnosed breast cancer  CURRENT TREATMENT: Completing radiation, resuming tamoxifen   BREAST CANCER HISTORY: From the original intake note:  Brandi Chen herself noted a change in her right breast while putting on a bathing suit. She immediately contacted Dr. Phineas Real, who works here in and confirmed a mass in the upper outer quadrant of the right breast, which she was unable to aspirate. He set the patient up for mammography at the breast Center 05/03/2014, and this confirmed a mass with lobulated margins in the upper outer quadrant which was easily palpable and which by ultrasound was hypoechoic and microlobulated, measuring 3.7 cm. The right axilla was unremarkable.  Biopsy of this mass 05/04/2014 showed ([id number]) and invasive ductal carcinoma, grade 2, estrogen and progesterone receptor positive, with an MIB-1 of 15%, and no HER-2 amplification, the signals ratio being 0.94 and the number per cell 1.70.  The patient's subsequent history is as detailed below.  INTERVAL HISTORY: Brandi Chen returns to the breast clinic accompanied by her sister Erby Pian. Since her last visit here, Kim underwent right lumpectomy and sentinel lymph node sampling, 12/21/2014. The final pathology ([id number]) showed an invasive ductal carcinoma measuring 2.5 cm, grade 2, with a repeat prognostic panel showing the tumor to be estrogen receptor positive at 97%, progesterone receptor positive at 38%, both with strong staining intensity, and  HER-2 was again negative, with a signals ratio 1.07 and the number per cell being 1.60.  An anterior margin from the surgery was focally positive. This was discussed at conference 01/05/2015 and the consensus was that the margin, skin, was adequate and all the patient needed was adjuvant radiation. This was started April 26 and is scheduled to be completed 03/11/2015. The patient has been holding tamoxifen until completion of radiation therapy.   REVIEW OF SYSTEMS: Brandi Chen did generally well with her surgery, with no unusual pain, fever, or bleeding. She did have a significant seroma which required placement of an axillary drain. He was evaluated with right mammography, tomography and ultrasound 01/25/2015. This showed only postsurgical changes including some periareolar skin thickening and an underlying seroma in the subareolar right breast. There were no suspicious masses or other abnormalities noted. Otherwise Brandi Chen still feels fatigued, although she continues to work. She has a cataract in her right eye and is seeing Dr. Idolina Primer regarding this. She feels short of breath when walking up stairs, but gets to the top without stopping. She is moderately constipated. She feels anxious and depressed. A detailed review of systems today was otherwise stable.   PAST MEDICAL HISTORY: Past Medical History  Diagnosis Date  . ADD (attention deficit disorder)   . Anxiety   . Depression   . Breast cancer 04/2014    right  . Wears glasses   . Complication of anesthesia     wakes up wild from gas    PAST SURGICAL HISTORY: Past Surgical History  Procedure Laterality Date  . Basal cell excised    . Multiple tooth extractions    . Breast lumpectomy with axillary lymph node biopsy Right 12/21/2014  Procedure: RIGHT BREAST LUMPECTOMY WITH AXILLARY LYMPH NODE BIOPSY;  Surgeon: [name], MD;  Location: [facility];  Service: General;  Laterality: Right;    FAMILY HISTORY Family History  Problem  Relation Age of Onset  . Cancer Mother     bladder  . Hypertension Father   . Heart attack Father   . Breast cancer Paternal Aunt 109  . Cancer Maternal Grandmother     ovarian/uterine   the patient's father died at the age of 69 either from a myocardial infarction or a stroke. The patient's mother died at the age of 59 from metastatic bladder cancer. She was a heavy smoker. The patient has one brother and one sister. There is no other cancer in the immediate family, although on the mother's side 1 and was diagnosed with what may have been cervical or ovarian cancer late in life, and on the father's side there was an aunt with breast cancer diagnosed in her late 36s  GYNECOLOGIC HISTORY:  Patient's last menstrual period was 08/27/2008. Menarche age 41, the patient is GX P0. She stopped having periods in 2009. She did not take hormone replacement. She did take Depo-Provera for many years because of heavy periods remotely.  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 She is starting her on business which includes dog sitting, painting, doing grocery shopping, and generally being a "girl Friday". She is divorced and lives with a roommate [name] who is disabled secondary to fibromyalgia. The patient's husband Barnabas Lister, who is a survivor of colon cancer, has his own locksmith business. The patient's sister Erby Pian is a stay-at-home mom here in [place]. The patient's brother lives in [place] where he works in conservation. The patient attends a [employer]    ADVANCED DIRECTIVES: Not in place. At the time of the patient's 03/02/2015 visit she requested another copy of the advanced directive papers that she could complete and have notarize the same day. She was referred to social work regarding this.   HEALTH MAINTENANCE: History  Substance Use Topics  . Smoking status: Never Smoker   . Smokeless tobacco: Never Used  . Alcohol  Use: 4.2 oz/week    7 Glasses of wine per week     Comment: social     Colonoscopy:  PAP:  Bone density:  Lipid panel:  No Known Allergies  Current Outpatient Prescriptions  Medication Sig Dispense Refill  . amphetamine-dextroamphetamine (ADDERALL) 10 MG tablet Take 20 mg by mouth 2 (two) times daily.     . cholecalciferol (VITAMIN D) 1000 UNITS tablet Take 1,000 Units by mouth daily. Takes  2 daily    . diazepam (VALIUM) 10 MG tablet Take 10 mg by mouth every 12 (twelve) hours as needed. 2 tablets (41m) in am may repeat 136min afternoon if needed    . divalproex (DEPAKOTE) 250 MG DR tablet Take 500 mg by mouth 3 (three) times daily. 500 mg 3 tablets once daily    . glucosamine-chondroitin 500-400 MG tablet Take 1 tablet by mouth 3 (three) times daily.      . tamoxifen (NOLVADEX) 20 MG tablet Take 20 mg by mouth daily.     No current facility-administered medications for this visit.    OBJECTIVE: Middle-aged white woman who appears stated age Fi42itals:   03/02/15 1051  BP: 133/82  Pulse: 75  Temp: 97.5 F (36.4 C)  Resp: 18  Body mass index is 25.38 kg/(m^2).    ECOG FS:1 - Symptomatic but completely ambulatory  Ocular: Sclerae unicteric, pupils round and equal Ear-nose-throat: Oropharynx clear, no thrush or other lesions Lymphatic: No cervical or supraclavicular adenopathy noted Lungs no rales or rhonchi Heart regular rate and rhythm Abd soft, nontender, positive bowel sounds, no masses palpated MSK no focal spinal tenderness, no upper extremity lymphedema Neuro: non-focal, well-oriented, anxious affect Breasts: The right breast is status post lumpectomy. There is some distortion of the breast contour chiefly at the areola and nipple. There is no evidence of disease recurrence. There is mild erythema over the radiation port area. The right axilla is benign, with no evidence of seroma or hematoma. The left breast is unremarkable  LAB RESULTS:  CMP       Component Value Date/Time   NA 139 10/28/2014 0826   NA 137 09/30/2014 1131   K 4.4 10/28/2014 0826   K 4.0 09/30/2014 1131   CL 97 09/30/2014 1131   CO2 32* 10/28/2014 0826   CO2 30 09/30/2014 1131   GLUCOSE 89 10/28/2014 0826   GLUCOSE 91 09/30/2014 1131   BUN 15.9 10/28/2014 0826   BUN 14 09/30/2014 1131   CREATININE 0.8 10/28/2014 0826   CREATININE 0.71 09/30/2014 1131   CALCIUM 9.3 10/28/2014 0826   CALCIUM 9.0 09/30/2014 1131   PROT 6.8 10/28/2014 0826   PROT 6.5 09/30/2014 1131   ALBUMIN 3.4* 10/28/2014 0826   ALBUMIN 4.0 09/30/2014 1131   AST 11 10/28/2014 0826   AST 14 09/30/2014 1131   ALT 10 10/28/2014 0826   ALT 9 09/30/2014 1131   ALKPHOS 52 10/28/2014 0826   ALKPHOS 41 09/30/2014 1131   BILITOT 0.37 10/28/2014 0826   BILITOT 0.4 09/30/2014 1131    I No results found for: SPEP  Lab Results  Component Value Date   WBC 3.4* 03/02/2015   NEUTROABS 2.0 03/02/2015   HGB 13.7 03/02/2015   HCT 40.5 03/02/2015   MCV 93.6 03/02/2015   PLT 165 03/02/2015      Chemistry      Component Value Date/Time   NA 139 10/28/2014 0826   NA 137 09/30/2014 1131   K 4.4 10/28/2014 0826   K 4.0 09/30/2014 1131   CL 97 09/30/2014 1131   CO2 32* 10/28/2014 0826   CO2 30 09/30/2014 1131   BUN 15.9 10/28/2014 0826   BUN 14 09/30/2014 1131   CREATININE 0.8 10/28/2014 0826   CREATININE 0.71 09/30/2014 1131      Component Value Date/Time   CALCIUM 9.3 10/28/2014 0826   CALCIUM 9.0 09/30/2014 1131   ALKPHOS 52 10/28/2014 0826   ALKPHOS 41 09/30/2014 1131   AST 11 10/28/2014 0826   AST 14 09/30/2014 1131   ALT 10 10/28/2014 0826   ALT 9 09/30/2014 1131   BILITOT 0.37 10/28/2014 0826   BILITOT 0.4 09/30/2014 1131       No results found for: LABCA2  No components found for: LABCA125  No results for input(s): INR in the last 168 hours.  Urinalysis    Component Value Date/Time   COLORURINE YELLOW 09/30/2014 [place] 09/30/2014 1131    LABSPEC 1.017 09/30/2014 1131   PHURINE 6.5 09/30/2014 1131   GLUCOSEU NEG 09/30/2014 1131   HGBUR NEG 09/30/2014 1131   BILIRUBINUR NEG 09/30/2014 1131   KETONESUR NEG 09/30/2014 1131   PROTEINUR NEG 09/30/2014 1131   UROBILINOGEN 0.2 09/30/2014 1131   NITRITE NEG 09/30/2014 1131  LEUKOCYTESUR NEG 09/30/2014 1131    STUDIES:  CLINICAL DATA: Patient is a 54 year old female with a recent diagnosis of invasive ductal carcinoma on image guided biopsy performed 05/04/2014. She presents today for evaluation of neoadjuvant tamoxifen.  EXAM: DIGITAL DIAGNOSTIC RIGHT MAMMOGRAM WITH CAD  ULTRASOUND RIGHT BREAST  COMPARISON: Mammogram and ultrasound performed 05/03/2014  ACR Breast Density Category c: The breast tissue is heterogeneously dense, which may obscure small masses.  FINDINGS: Right unilateral digital diagnostic mammography demonstrates a heterogeneously dense parenchymal pattern which may obscure detection of small masses. A ribbon shaped metal tissue marker is identified in the outer periareolar right breast denoting site of biopsy-proven malignancy. There is an associated round mass with obscured margins that measures approximately 2.5 x 2.5 x 2.5 cm and is not significantly changed from comparison mammography. Spot-compression view in the CC projection demonstrates close involvement of the right areola and mild skin dimpling which is likely increased from comparison spot-compression view.  Mammographic images were processed with CAD.  Physical exam demonstrates a superficial hard palpable mass in the upper outer periareolar right breast. There is apparent dimpling of the outer right areola  Ultrasound is performed. This again demonstrates a heterogeneously hypoechoic round mass in the superficial upper outer right breast at 10 o'clock 1 cm from the nipple measuring approximately 2.6 x 2.2 x 2.5 cm (previously 2.5 x 2.4 x 2.4 cm). This is not  significantly changed on ultrasound measurements allowing for differences in technique. Ultrasound evaluation of the subareolar right breast is limited given dense acoustic shadowing, however the medial edge of this mass is approximately 3 to 5 mm from the base of the nipple.  IMPRESSION: Biopsy-proven malignancy in the upper outer subareolar right breast. This is not significantly changed in size following neoadjuvant tamoxifen.  The mass is in close proximity to the base of the right nipple with possible increased dimpling of the right areola. Recommend correlation with physical exam.  RECOMMENDATION: Recommend continued surgical management.  I have discussed the findings and recommendations with the patient. Results were also provided in writing at the conclusion of the visit. If applicable, a reminder letter will be sent to the patient regarding the next appointment.  BI-RADS CATEGORY 6: Known biopsy-proven malignancy.   Electronically Signed  By: Andres Shad  On: 11/11/2014 15:34     ASSESSMENT: 54 y.o. [place] woman status post right upper outer quadrant breast biopsy 05/04/2014 for a clinical T2 N0, stage IIA invasive ductal carcinoma, grade 2, estrogen and progesterone receptor positive, HER-2 not amplified, with an MIB-1 of 15%  (1) Oncotype score of 19 predicts an out of the breast recurrence within 10 years of 12% if the patient's only systemic therapy is tamoxifen for 5 years. It also predicts an additional 3-4% risk reduction with CAF chemotherapy  (2) the patient opted against adjuvant chemotherapy  (3) started neoadjuvant tamoxifen 06/04/2014, held during adjuvant radiation  (4) status post right lumpectomy and sentinel lymph node sampling for a pT2 pN0, stage IIA invasive ductal carcinoma, grade 2, repeat prognostic panel again estrogen and progesterone receptor positive, HER-2 negative, with a focally present anterior soft tissue resection  margin (skin)  (5) adjuvant radiation to be completed 03/11/2015  (6) to resume tamoxifen 03/23/2015   PLAN: Brandi Chen has recovered well from her surgery and is tolerating radiation generally well also. I think it might be a good idea for her to have a couple of weeks "vacation" before starting tamoxifen again so we have chosen June 1 as the  first day for her to go back on that drug. She generally has tolerated it well.  The plan will be to continue tamoxifen for at least 2 years and then consider switching to an aromatase inhibitor. In the meantime she understands she will still have some fatigue even after radiation is completed. If she wishes to work on that the best thing for her would be to start on a regular exercise program.  Brandi Chen has a good understanding of the overall plan. She agrees with it. She knows the goal of treatment in her case is cure. She will call with any problems that may develop before her next visit here.  Chauncey Cruel, MD   03/02/2015 11:00 AM

## 2015-03-02 NOTE — Telephone Encounter (Signed)
Appointments made and avs printed for patient °

## 2015-03-03 ENCOUNTER — Ambulatory Visit: Payer: 59 | Admitting: Radiation Oncology

## 2015-03-03 ENCOUNTER — Ambulatory Visit: Payer: 59

## 2015-03-03 ENCOUNTER — Ambulatory Visit
Admission: RE | Admit: 2015-03-03 | Discharge: 2015-03-03 | Disposition: A | Discharge status: Home / Self Care | Payer: 59 | Source: Ambulatory Visit | Attending: Radiation Oncology | Admitting: Radiation Oncology

## 2015-03-03 DIAGNOSIS — Z510 Encounter for antineoplastic radiation therapy: Secondary | ICD-10-CM | POA: Diagnosis not present

## 2015-03-04 ENCOUNTER — Ambulatory Visit: Payer: 59

## 2015-03-04 ENCOUNTER — Ambulatory Visit
Admission: RE | Admit: 2015-03-04 | Discharge: 2015-03-04 | Disposition: A | Discharge status: Home / Self Care | Payer: 59 | Source: Ambulatory Visit | Attending: Radiation Oncology | Admitting: Radiation Oncology

## 2015-03-04 DIAGNOSIS — Z510 Encounter for antineoplastic radiation therapy: Secondary | ICD-10-CM | POA: Diagnosis not present

## 2015-03-07 ENCOUNTER — Ambulatory Visit: Payer: 59

## 2015-03-07 ENCOUNTER — Ambulatory Visit
Admission: RE | Admit: 2015-03-07 | Discharge: 2015-03-07 | Disposition: A | Discharge status: Home / Self Care | Payer: 59 | Source: Ambulatory Visit | Attending: Radiation Oncology | Admitting: Radiation Oncology

## 2015-03-07 DIAGNOSIS — Z510 Encounter for antineoplastic radiation therapy: Secondary | ICD-10-CM | POA: Diagnosis not present

## 2015-03-08 ENCOUNTER — Ambulatory Visit: Payer: 59 | Admitting: Radiation Oncology

## 2015-03-08 ENCOUNTER — Ambulatory Visit
Admission: RE | Admit: 2015-03-08 | Discharge: 2015-03-08 | Disposition: A | Discharge status: Home / Self Care | Payer: 59 | Source: Ambulatory Visit | Attending: Radiation Oncology | Admitting: Radiation Oncology

## 2015-03-08 DIAGNOSIS — Z510 Encounter for antineoplastic radiation therapy: Secondary | ICD-10-CM | POA: Diagnosis not present

## 2015-03-08 DIAGNOSIS — C50411 Malignant neoplasm of upper-outer quadrant of right female breast: Secondary | ICD-10-CM

## 2015-03-08 NOTE — Progress Notes (Signed)
Weekly Management Note Current Dose:  46.72 Gy  Projected Dose: 52.72 Gy   Narrative:  The patient presents for routine under treatment assessment.  CBCT/MVCT images/Port film x-rays were reviewed.  The chart was checked. Doing well. No complaints. Itching well controlled. Grateful for care.   Physical Findings: Dermatitis medially. Examined on tx table.   Impression:  The patient is tolerating radiation.  Plan:  Continue treatment as planned. Continue radiaplex. Discussed post RT skin care.

## 2015-03-08 NOTE — Progress Notes (Signed)
patint seen in the back,lianc table by MD,not sent to nursing for asessment 8:31 AM

## 2015-03-09 ENCOUNTER — Ambulatory Visit
Admission: RE | Admit: 2015-03-09 | Discharge: 2015-03-09 | Disposition: A | Discharge status: Home / Self Care | Payer: 59 | Source: Ambulatory Visit | Attending: Radiation Oncology | Admitting: Radiation Oncology

## 2015-03-09 ENCOUNTER — Ambulatory Visit: Payer: 59

## 2015-03-09 DIAGNOSIS — Z510 Encounter for antineoplastic radiation therapy: Secondary | ICD-10-CM | POA: Diagnosis not present

## 2015-03-10 ENCOUNTER — Ambulatory Visit: Payer: 59

## 2015-03-10 ENCOUNTER — Ambulatory Visit
Admission: RE | Admit: 2015-03-10 | Discharge: 2015-03-10 | Disposition: A | Discharge status: Home / Self Care | Payer: 59 | Source: Ambulatory Visit | Attending: Radiation Oncology | Admitting: Radiation Oncology

## 2015-03-10 ENCOUNTER — Encounter: Payer: Self-pay | Admitting: *Deleted

## 2015-03-10 DIAGNOSIS — Z510 Encounter for antineoplastic radiation therapy: Secondary | ICD-10-CM | POA: Diagnosis not present

## 2015-03-10 DIAGNOSIS — C50411 Malignant neoplasm of upper-outer quadrant of right female breast: Secondary | ICD-10-CM

## 2015-03-10 MED ORDER — BIAFINE EX EMUL
CUTANEOUS | Status: DC | PRN
  Administered 2015-03-10: 17:00:00 via TOPICAL

## 2015-03-10 NOTE — Progress Notes (Signed)
[place] Social Work  Clinical Social Work was referred by pt  to review and complete healthcare advance directives.  Clinical Social Worker met with patient in [place] office.  The patient designated Erin Sons as their primary healthcare agent and Lizvette Lightsey as their secondary agent.  Patient also completed healthcare living will.    Clinical Social Worker notarized documents and made copies for patient/family. Clinical Social Worker will send documents to medical records to be scanned into patient's chart. Clinical Social Worker encouraged patient/family to contact with any additional questions or concerns.  Loren Racer, [place] Worker [place]  [place] Phone: [phone] Fax: [phone]

## 2015-03-11 ENCOUNTER — Ambulatory Visit: Payer: 59

## 2015-03-11 ENCOUNTER — Ambulatory Visit
Admission: RE | Admit: 2015-03-11 | Discharge: 2015-03-11 | Disposition: A | Discharge status: Home / Self Care | Payer: 59 | Source: Ambulatory Visit | Attending: Radiation Oncology | Admitting: Radiation Oncology

## 2015-03-11 ENCOUNTER — Encounter: Payer: Self-pay | Admitting: Radiation Oncology

## 2015-03-11 DIAGNOSIS — Z510 Encounter for antineoplastic radiation therapy: Secondary | ICD-10-CM | POA: Diagnosis not present

## 2015-03-13 NOTE — Progress Notes (Signed)
  Radiation Oncology         [phone]) [phone] ________________________________  Name: STORMEY WILBORN MRN: [id number]  Date: 03/11/2015  DOB: [birth year]  End of Treatment Note  Diagnosis:   Breast cancer of upper-outer quadrant of right female breast   Staging form: Breast, AJCC 7th Edition     Clinical: Stage IIA (T2, N0, cM0) - Unsigned       Staging comments: Staged at breast conference 7.22.15      Pathologic stage from 12/22/2014: Stage IIA (yT2, N0, cM0) - Signed by Enid Cutter, MD on 01/09/2015       Staging comments: Staged on final lumpectomy specimen by Dr. Lyndon Code.  Indication for treatment: Curative    Radiation treatment dates:   02/09/2015-03/11/2015  Site/dose:    Right breast / 45 Gray @ 1.8 [name] per fraction x 25 fractions Right breast boost / 16 Gray at [employer] per fraction x 8 fractions  Beams/energy:  Opposed Tangents / 6 MV photons En face / 12 MeV electonrs  Narrative: The patient tolerated radiation treatment relatively well.   She developed a small amount of dermatitis medially.   Plan: The patient has completed radiation treatment. The patient will return to radiation oncology clinic for routine followup in one month. I advised them to call or return sooner if they have any questions or concerns related to their recovery or treatment.  ------------------------------------------------  Thea Silversmith, MD

## 2015-03-15 ENCOUNTER — Ambulatory Visit: Payer: 59

## 2015-03-16 ENCOUNTER — Ambulatory Visit: Payer: 59

## 2015-03-17 ENCOUNTER — Ambulatory Visit: Payer: 59

## 2015-03-18 ENCOUNTER — Ambulatory Visit: Payer: 59

## 2015-03-18 ENCOUNTER — Telehealth: Payer: Self-pay | Admitting: *Deleted

## 2015-03-18 NOTE — Telephone Encounter (Signed)
Patient called stating she received a letter in the mail saying her next mammogram should be in July 2016, but she just finished her radiation last week, , asked to speak with [facility], will pass this on to Val and Dr. August Albino doubt  That her mammogram should be this soon also,will have Val call her, Brandi Chen asked to be transferred to [place],  3:17 PM

## 2015-03-20 NOTE — Progress Notes (Signed)
Radiation Oncology         [phone]) [phone] ________________________________  Name: Brandi Chen      MRN: [id number]          Date: 02/01/15            DOB: 08/16/1961  Optical Surface Tracking Plan:  Since intensity modulated radiotherapy (IMRT) and 3D conformal radiation treatment methods are predicated on accurate and precise positioning for treatment, intrafraction motion monitoring is medically necessary to ensure accurate and safe treatment delivery.  The ability to quantify intrafraction motion without excessive ionizing radiation dose can only be performed with optical surface tracking. Accordingly, surface imaging offers the opportunity to obtain 3D measurements of patient position throughout IMRT and 3D treatments without excessive radiation exposure.  I am ordering optical surface tracking for this patient's upcoming course of radiotherapy. ________________________________ Signature   Reference:   Ursula Alert, J, et al. Surface imaging-based analysis of intrafraction motion for breast radiotherapy patients.Journal of [place], n. 6, nov. 2014. ISSN [id number].   Available at: <http://www.jacmp.org/index.php/jacmp/article/view/4957>.

## 2015-03-20 NOTE — Progress Notes (Signed)
Name: Brandi Chen   MRN: [id number]  Date:  03/01/15   DOB: [birth year]  Status:outpatient    DIAGNOSIS: Breast cancer of upper-outer quadrant of right female breast   Staging form: Breast, AJCC 7th Edition     Clinical: Stage IIA (T2, N0, cM0) - Unsigned       Staging comments: Staged at breast conference 7.22.15      Pathologic stage from 12/22/2014: Stage IIA (yT2, N0, cM0) - Signed by Enid Cutter, MD on 01/09/2015       Staging comments: Staged on final lumpectomy specimen by Dr. Lyndon Code.  CONSENT VERIFIED: yes   SET UP: Patient is setup supine   IMMOBILIZATION:  The following immobilization was used:Custom Moldable Pillow, breast board.   NARRATIVE: Brandi Chen underwent complex simulation and treatment planning for her boost treatment today.  Her tumor volume was outlined on the planning CT scan. The depth of her cavity was felt to be appropriate for treatment with electrons    12  MeV electrons will be prescribed to the 100%  isodose line.   I personally oversaw and approved the construction of a unique block which will be used for beam modification purposes.  An isodose plan is requested.

## 2015-03-22 ENCOUNTER — Ambulatory Visit: Payer: 59

## 2015-03-23 ENCOUNTER — Telehealth: Payer: Self-pay | Admitting: *Deleted

## 2015-03-23 ENCOUNTER — Ambulatory Visit: Payer: 59

## 2015-03-23 NOTE — Telephone Encounter (Signed)
TC back to pt after she left a voice mail regarding her stomach and new medicines. Spoke with pt and she states she was feeling like she had an ulcer-pain before and after she ate, pain in stomach at night, decreased appetite. She was seen at her PCP's office today and was given 2 prescriptions for her stomach.  Plus she is to start her tamoxifen today.Marland Kitchen She wanted to make sure it was ok to start her tamoxifen.  She has not picked up her other 2 prescriptions yet.  Advised her to go ahead and start these meds as directed, also advised her to be mindful of her diet-decrease acidic/spicy/greasy foods, caffeine etc. Instructed pt to call PCP if stomach not better over the next week and to also let us know how she is. To stick with bland foods for a few days and let her stomach do some healing.  Advised her that it was ok to start her tamoxifen. Pt. verbalized understanding.

## 2015-03-24 ENCOUNTER — Ambulatory Visit: Payer: 59

## 2015-03-25 ENCOUNTER — Ambulatory Visit: Payer: 59

## 2015-03-28 ENCOUNTER — Ambulatory Visit: Payer: 59

## 2015-03-29 NOTE — Telephone Encounter (Signed)
Patient should wait at least 6 mos from RT to schedule mammogram.

## 2015-04-07 ENCOUNTER — Encounter: Payer: Self-pay | Admitting: Radiation Oncology

## 2015-04-07 ENCOUNTER — Ambulatory Visit
Admission: RE | Admit: 2015-04-07 | Discharge: 2015-04-07 | Disposition: A | Discharge status: Home / Self Care | Payer: 59 | Source: Ambulatory Visit | Attending: Radiation Oncology | Admitting: Radiation Oncology

## 2015-04-07 VITALS — BP 126/76 | HR 91 | Temp 97.6°F | Resp 20 | Ht 65.0 in | Wt 151.6 lb

## 2015-04-07 DIAGNOSIS — C50411 Malignant neoplasm of upper-outer quadrant of right female breast: Secondary | ICD-10-CM

## 2015-04-07 NOTE — Progress Notes (Signed)
Follow up s/p radiation right breast 02/09/15-03/11/15, well healed, taking tamoxifen 20mg  oral daily since 03/23/15, went to primary MD 2-3 weeks ago  Abdominal pain, saw PA in office was given rx for she bel;ieves an ulcer for 5 days, stopped when she quit work,believes was stress related, a lot better now, appetite good,  2:40 PM BP 126/76 mmHg  Pulse 91  Temp(Src) 97.6 F (36.4 C) (Oral)  Resp 20  Ht 5\' 5"  (1.651 m)  Wt 151 lb 9.6 oz (68.765 kg)  BMI 25.23 kg/m2  LMP 08/27/2008  Wt Readings from Last 3 Encounters:  04/07/15 151 lb 9.6 oz (68.765 kg)  03/02/15 152 lb 8 oz (69.174 kg)  03/01/15 151 lb 6.4 oz (68.675 kg)

## 2015-04-07 NOTE — Progress Notes (Signed)
   Department of Radiation Oncology  Phone:  [phone] Fax:        [phone]   Name: Brandi Chen MRN: [id number]  DOB: May 30, 1961  Date: 04/07/2015  Follow Up Visit Note  Diagnosis: Breast cancer of upper-outer quadrant of right female breast   Staging form: Breast, AJCC 7th Edition     Clinical: Stage IIA (T2, N0, cM0) - Unsigned       Staging comments: Staged at breast conference 7.22.15      Pathologic stage from 12/22/2014: Stage IIA (yT2, N0, cM0) - Signed by Brandi Cutter, MD on 01/09/2015       Staging comments: Staged on final lumpectomy specimen by Dr. Lyndon Code.    Summary and Interval since last radiation: 61 Gy for 33 fractions completed 03/11/15. Right breast / 45 Gray @ 1.8 [name] per fraction x 25 fractions Right breast boost / 16 Gray at [employer] per fraction x 8 fractions  Interval History: Brandi Chen presents today for routine followup. Follow up s/p radiation right breast 02/09/15-03/11/15. She is prescribed to take tamoxifen 20mg  oral daily since 03/23/15. She went to her primary MD 2-3 weeks ago. She had abdominal pain and saw a PA and was given a rx for what she believes was an ulcer for 5 days. Stopped when she quit work and believes it was stress related. She states she is a lot better now with a good appetite. She is forgetting to take her tamoxifen. She wants to postpone her mammogram until January. She prefer to be seen by followed by Dr. Jana Hakim instead of Dr. Barry Dienes.  Physical Exam:  Filed Vitals:   04/07/15 1436  BP: 126/76  Pulse: 91  Temp: 97.6 F (36.4 C)  TempSrc: Oral  Resp: 20  Height: 5\' 5"  (1.651 m)  Weight: 151 lb 9.6 oz (68.765 kg)  Skin is healing well with minimal hyperpigmentation.  IMPRESSION: Brandi Chen is a 54 y.o. female with Stage IIA (T2, N0, cM0) right breast cancer.  PLAN:  She is doing well. We discussed the need for follow up every 4-6 months which she has scheduled.  We discussed the need for yearly mammograms which she can  schedule with her OBGYN or with medical oncology. We discussed the need for sun protection in the treated area.  She can always call me with questions.  I will follow up with her on an as needed basis.  This document serves as a record of services personally performed by Thea Silversmith, MD. It was created on her behalf by Darcus Austin, a trained medical scribe. The creation of this record is based on the scribe's personal observations and the provider's statements to them. This document has been checked and approved by the attending provider.   Thea Silversmith, MD

## 2015-04-08 ENCOUNTER — Telehealth: Payer: Self-pay | Admitting: *Deleted

## 2015-04-08 NOTE — Telephone Encounter (Signed)
Let pt know she is fine to have shingles vaccine.  Pt voiced understanding.

## 2015-04-08 NOTE — Telephone Encounter (Signed)
Called patient to inform of mammogram, lvm for a return call

## 2015-04-08 NOTE — Telephone Encounter (Signed)
VM message from pt received @ 10:26 am. Pt inquiring about receiving Shingles vaccination. She has completed XRT 3 weeks ago. Pharmacist @ [facility] suggested shingles Vaccine (pt had chickenpox in 1st grade). Pt asked Dr. Pablo Ledger in [place] if it was ok to get this. Pt states Dr. Pablo Ledger would like to defer to Dr. Jana Hakim on this. Please advise.

## 2015-05-12 IMAGING — MG MM DIGITAL DIAGNOSTIC UNILAT*R*
4 series · 4 of 4 positions shown · non-contrast
Comparison: Mammogram and ultrasound performed 05/03/2014

CLINICAL DATA: Patient is a 53-year-old female with a recent
diagnosis of invasive ductal carcinoma on image guided biopsy
performed 05/04/2014. She presents today for evaluation of
neoadjuvant tamoxifen.

EXAM:
DIGITAL DIAGNOSTIC  RIGHT MAMMOGRAM WITH CAD
ULTRASOUND RIGHT BREAST

[R CC (1 of 2)]
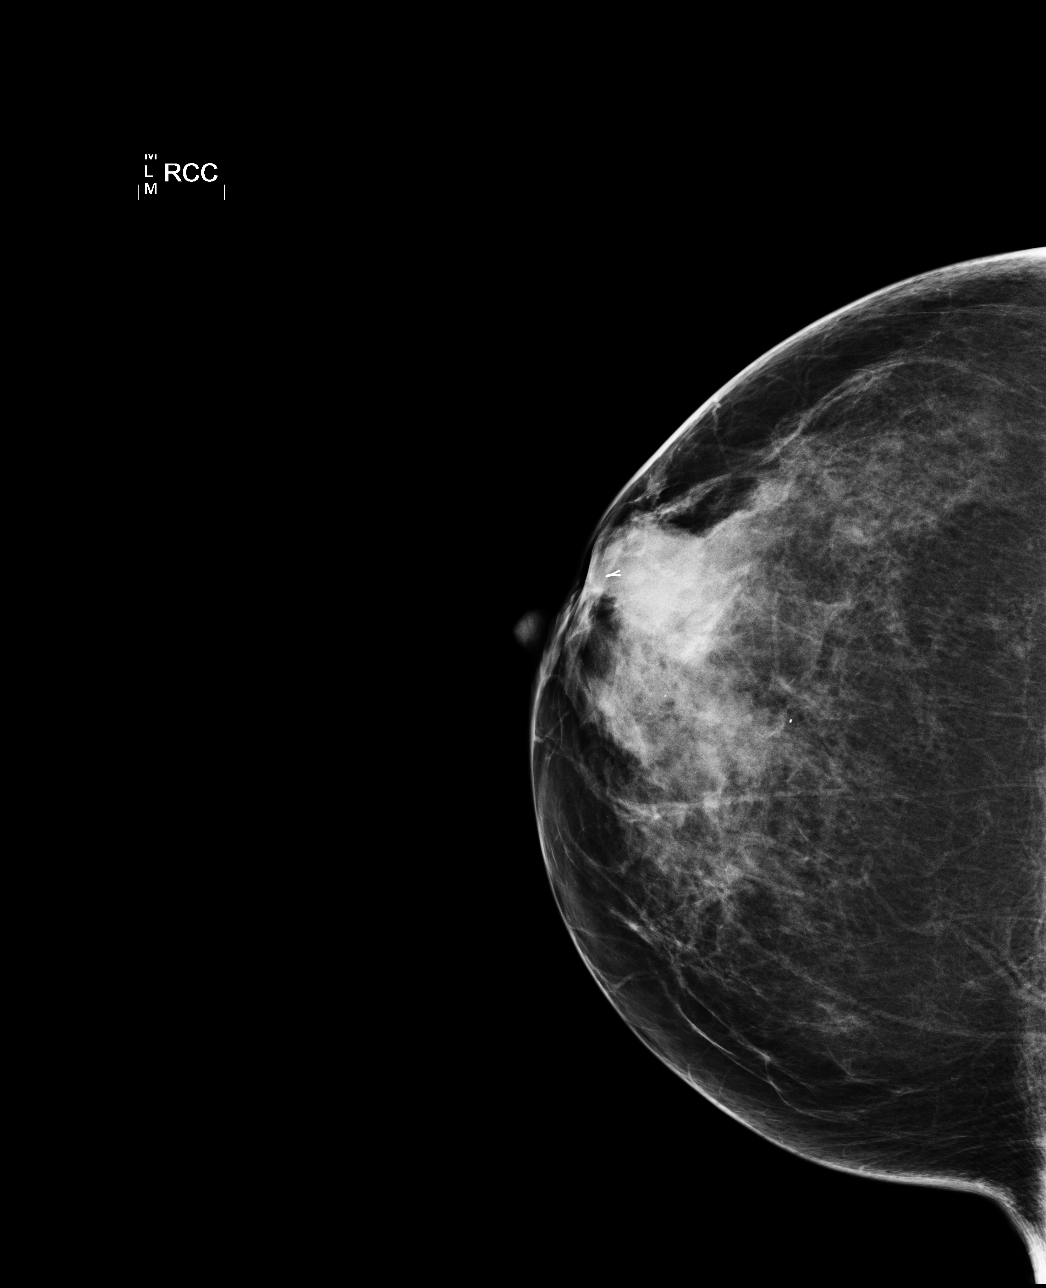

[R MLO (1 of 2)]
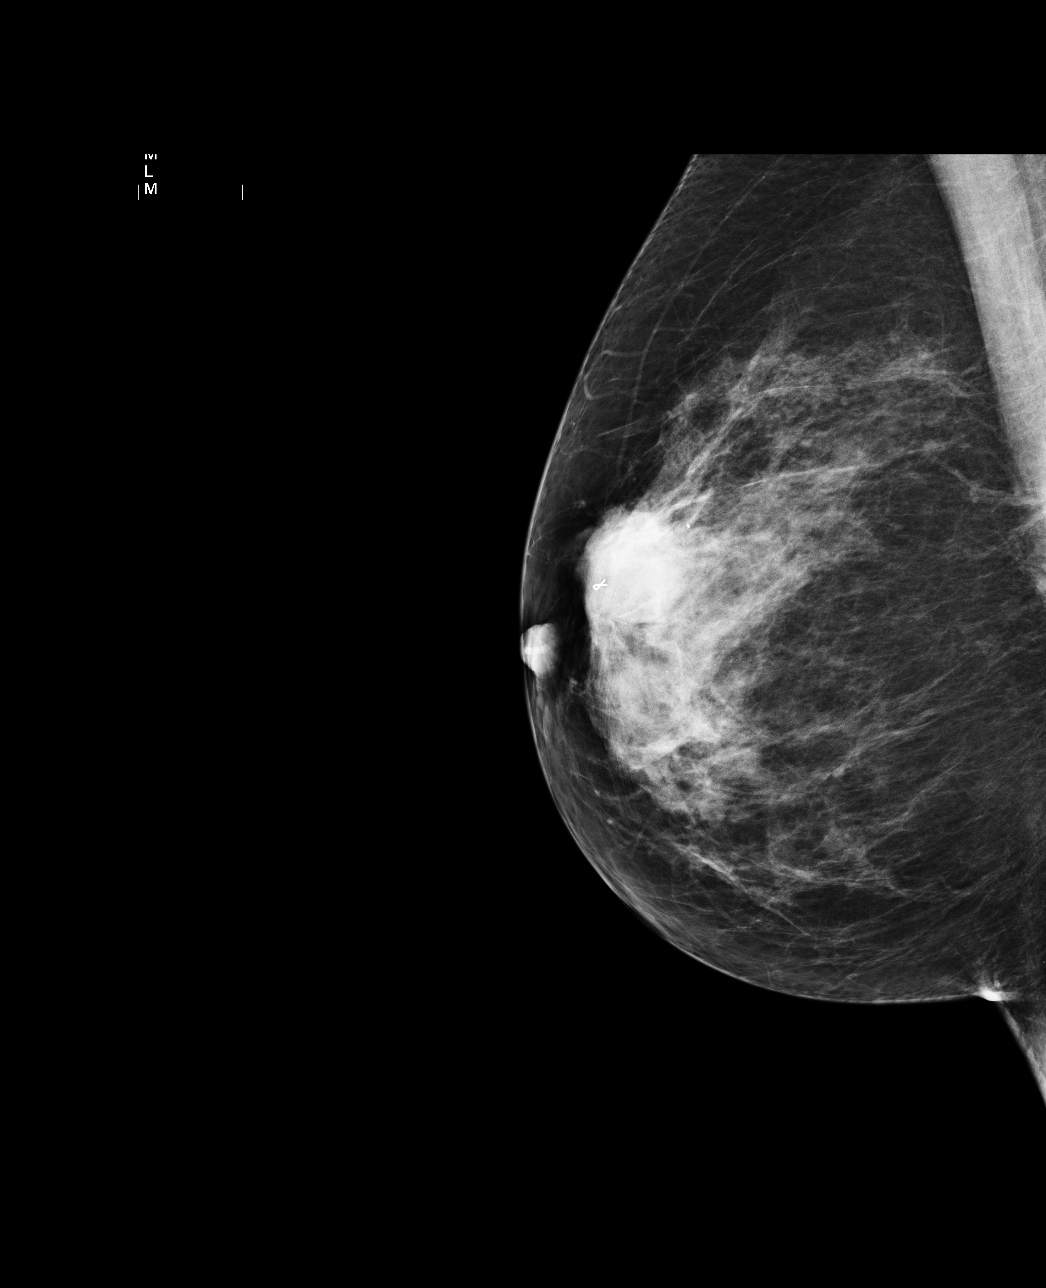

[R CC (2 of 2)]
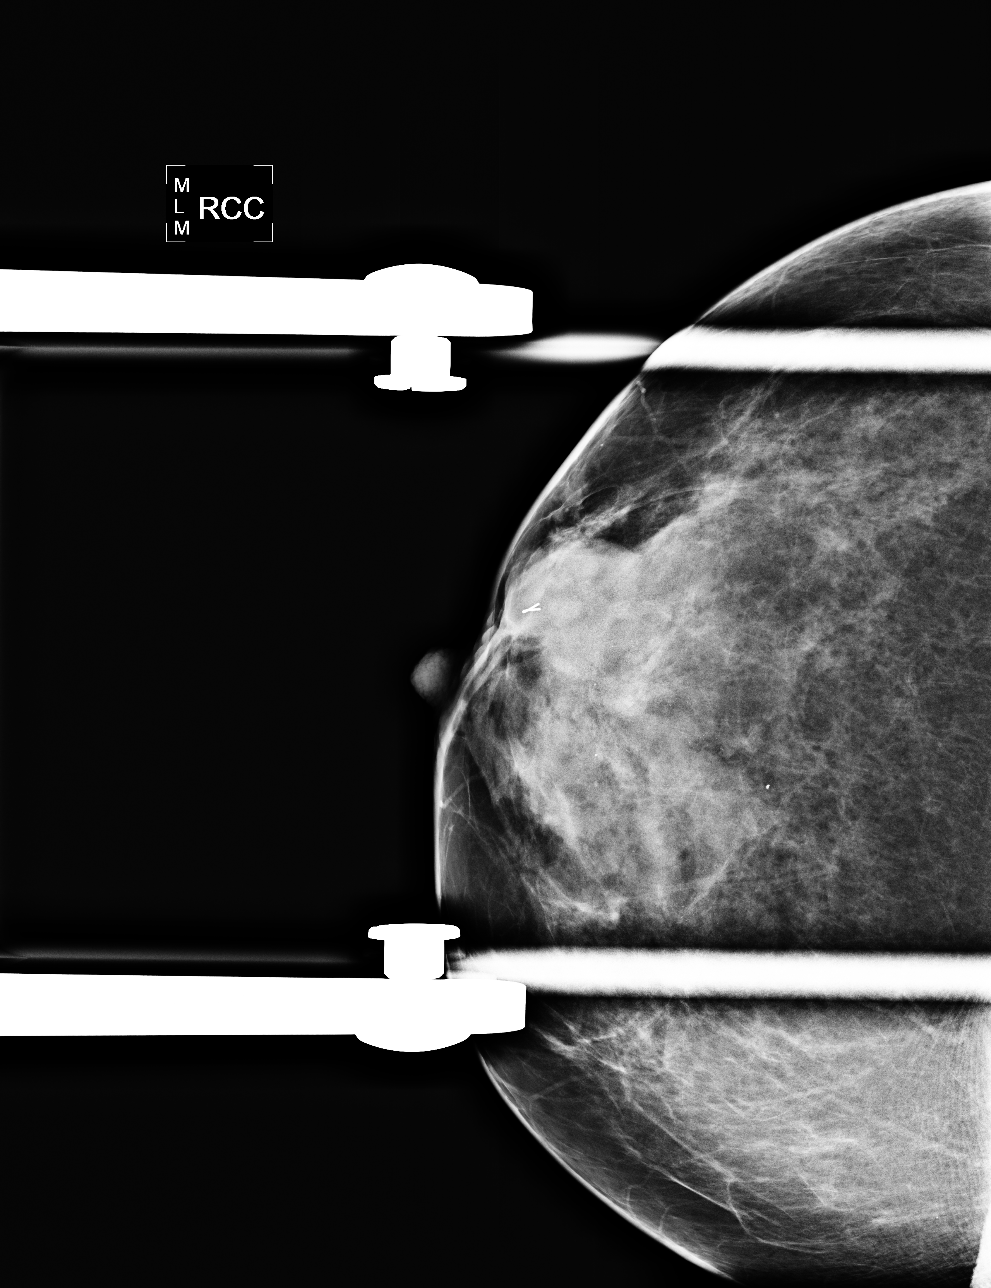

[R MLO (2 of 2)]
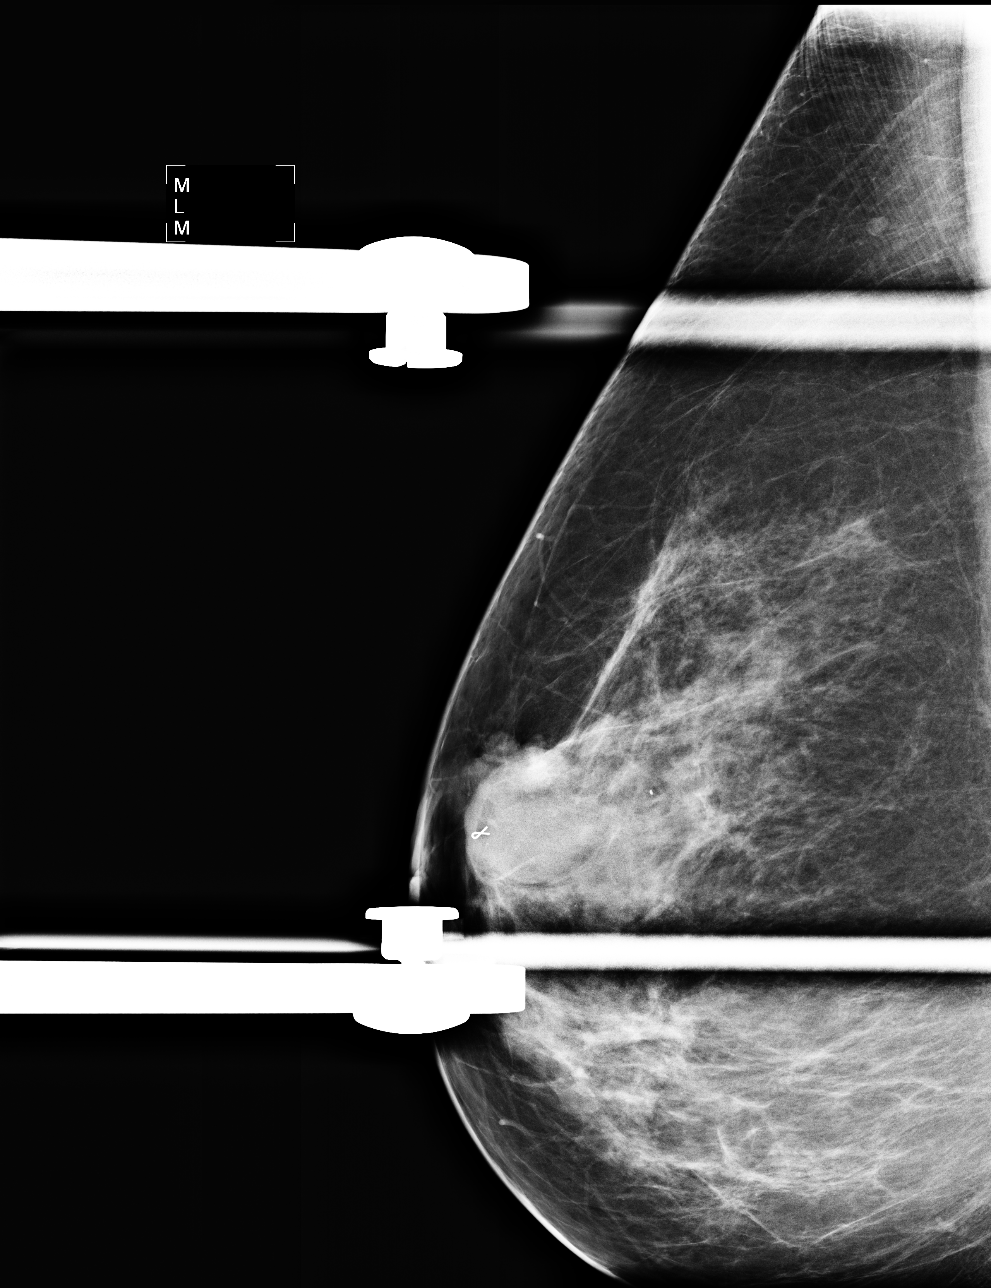

[4 of 4 positions shown; findings below may reference images not displayed]

ACR Breast Density Category c: The breast tissue is heterogeneously
dense, which may obscure small masses.
FINDINGS: Right unilateral digital diagnostic mammography demonstrates a
heterogeneously dense parenchymal pattern which may obscure
detection of small masses. A ribbon shaped metal tissue marker is
identified in the outer periareolar right breast denoting site of
biopsy-proven malignancy. There is an associated round mass with
obscured margins that measures approximately 2.5 x 2.5 x 2.5 cm and
is not significantly changed from comparison mammography.
Spot-compression view in the CC projection demonstrates close
involvement of the right areola and mild skin dimpling which is
likely increased from comparison spot-compression view.

Mammographic images were processed with CAD.

Physical exam demonstrates a superficial hard palpable mass in the
upper outer periareolar right breast. There is apparent dimpling of
the outer right areola

Ultrasound is performed. This again demonstrates a heterogeneously
hypoechoic round mass in the superficial upper outer right breast at
10 o'clock 1 cm from the nipple measuring approximately 2.6 x 2.2 x
2.5 cm (previously 2.5 x 2.4 x 2.4 cm). This is not significantly
changed on ultrasound measurements allowing for differences in
technique. Ultrasound evaluation of the subareolar right breast is
limited given dense acoustic shadowing, however the medial edge of
this mass is approximately 3 to 5 mm from the base of the nipple.
IMPRESSION: Biopsy-proven malignancy in the upper outer subareolar right breast.
This is not significantly changed in size following neoadjuvant
tamoxifen.

The mass is in close proximity to the base of the right nipple with
possible increased dimpling of the right areola. Recommend
correlation with physical exam.

RECOMMENDATION:
Recommend continued surgical management.

I have discussed the findings and recommendations with the patient.
Results were also provided in writing at the conclusion of the
visit. If applicable, a reminder letter will be sent to the patient
regarding the next appointment.

BI-RADS CATEGORY  6: Known biopsy-proven malignancy.

## 2015-06-13 ENCOUNTER — Other Ambulatory Visit: Payer: Self-pay | Admitting: *Deleted

## 2015-06-13 ENCOUNTER — Telehealth: Payer: Self-pay | Admitting: *Deleted

## 2015-06-13 ENCOUNTER — Ambulatory Visit: Payer: 59 | Admitting: Nurse Practitioner

## 2015-06-13 ENCOUNTER — Other Ambulatory Visit: Payer: 59

## 2015-06-13 NOTE — Telephone Encounter (Signed)
Called pt to inquire why she did not come to her appt on today. No answer bur left a detailed message on VM for pt to call this nurse back @ [phone], so we can get the missed appt rescheduled. Message to be fwd to Gentry Fitz, NP.

## 2015-06-13 NOTE — Telephone Encounter (Signed)
Voicemail from patient.  "Today was cancelled previously.  I called and cancelled for today two weeks ago after talking with Dr. Virgie Dad nurse."

## 2015-06-14 ENCOUNTER — Telehealth: Payer: Self-pay | Admitting: Oncology

## 2015-06-14 ENCOUNTER — Telehealth: Payer: Self-pay | Admitting: Adult Health

## 2015-06-14 NOTE — Telephone Encounter (Signed)
I left Ms. Wolz a voicemail and asked that she return my call to schedule her survivorship visit now that she has completed treatment for breast cancer.  I left my direct office number for her to return my call.  We look forward to participating in her care.   Mike Craze, NP [place] [phone]

## 2015-06-14 NOTE — Telephone Encounter (Signed)
Left message to confirm appointment in October. Mailed calendar

## 2015-06-16 ENCOUNTER — Telehealth: Payer: Self-pay | Admitting: Adult Health

## 2015-06-16 NOTE — Telephone Encounter (Signed)
I received a return call from Ms. Pautz regarding her survivorship clinic visit.  She left a voicemail letting me know that she is "doing fine" and doesn't feel like she needs a survivorship visit at this time.  She is starting a new job and only receives her work schedule about 1 week in advance, making it difficult to make appointments.    I returned her call and left a voicemail, expressing my understanding and to let her know that I would plan on mailing her a copy of her survivorship care plan to her home for her review, along with some other survivorship resources.  I encouraged her to call me if this plan was not okay, if she has any additional questions, or if she ever changes her mind and would like to see [place] in survivorship.    We will mail her a copy of her care plan and will not attempt to make any additional survivorship appts at this time.  We are more than happy to see her at any time in the future, if needed.    Mike Craze, NP [place] [phone]

## 2015-06-21 ENCOUNTER — Other Ambulatory Visit: Payer: Self-pay | Admitting: Oncology

## 2015-06-30 ENCOUNTER — Encounter: Payer: Self-pay | Admitting: Adult Health

## 2015-06-30 NOTE — Progress Notes (Signed)
The Survivorship Care Plan was mailed to Brandi Chen as she reported not being able to come in to the Survivorship Clinic for an in-person visit at this time. A letter was mailed to her outlining the purpose of the content of the care plan, as well as encouraging her to reach out to me with any questions or concerns.  My business card was included in the correspondence to the patient as well.  A copy of the care plan was also routed/faxed/mailed to  [name], MD, the patient's PCP.  I will not be placing any follow-up appointments to the Survivorship Clinic for Brandi Chen, but I am happy to see her at any time in the future for any survivorship concerns that may arise. Thank you for allowing me to participate in her care!  Mike Craze, NP [place] [phone]

## 2015-07-05 ENCOUNTER — Telehealth: Payer: Self-pay | Admitting: Adult Health

## 2015-07-05 NOTE — Telephone Encounter (Signed)
Ms. Klosinski called me with questions and concerns regarding her survivorship care plan she received in the mail, after declining an in-person survivorship visit.    She tells me that she tried to call the [facility] to get her mammogram scheduled and they no longer accept her insurance.  She tells me that she has historically had mammograms completed at [facility] prior to her breast cancer diagnosis, but that she was always referred to the [facility] for a second opinion with any suspicious findings.  She is unsure what to do now to get her mammogram scheduled.   I encouraged her to contact [facility] and check to see if they take her insurance.  If they do, then Gentry Fitz, NP can re-order her mammogram to completed at that location and the patient can have her records transferred from the [facility] to [place].  I also sent an InBasket message to [employer] to make her aware of my conversation with the patient.   I encouraged Ms. Lair to call me with any other questions or concerns before her next appt here at the cancer center.   Mike Craze, NP [place] [phone]

## 2015-07-27 ENCOUNTER — Encounter: Payer: Self-pay | Admitting: Nurse Practitioner

## 2015-07-27 ENCOUNTER — Telehealth: Payer: Self-pay | Admitting: Nurse Practitioner

## 2015-07-27 ENCOUNTER — Ambulatory Visit (HOSPITAL_BASED_OUTPATIENT_CLINIC_OR_DEPARTMENT_OTHER): Payer: 59 | Admitting: Nurse Practitioner

## 2015-07-27 ENCOUNTER — Other Ambulatory Visit (HOSPITAL_BASED_OUTPATIENT_CLINIC_OR_DEPARTMENT_OTHER): Payer: 59

## 2015-07-27 ENCOUNTER — Other Ambulatory Visit: Payer: Self-pay | Admitting: Oncology

## 2015-07-27 VITALS — BP 117/81 | HR 81 | Temp 98.3°F | Resp 18 | Ht 65.0 in | Wt 148.8 lb

## 2015-07-27 DIAGNOSIS — F418 Other specified anxiety disorders: Secondary | ICD-10-CM

## 2015-07-27 DIAGNOSIS — C50919 Malignant neoplasm of unspecified site of unspecified female breast: Secondary | ICD-10-CM

## 2015-07-27 DIAGNOSIS — C50411 Malignant neoplasm of upper-outer quadrant of right female breast: Secondary | ICD-10-CM | POA: Diagnosis not present

## 2015-07-27 LAB — CBC WITH DIFFERENTIAL/PLATELET
BASO%: 0.5 % (ref 0.0–2.0)
Basophils Absolute: 0 10*3/uL (ref 0.0–0.1)
EOS ABS: 0 10*3/uL (ref 0.0–0.5)
EOS%: 1 % (ref 0.0–7.0)
HEMATOCRIT: 41.6 % (ref 34.8–46.6)
HEMOGLOBIN: 13.9 g/dL (ref 11.6–15.9)
LYMPH#: 1.1 10*3/uL (ref 0.9–3.3)
LYMPH%: 29.1 % (ref 14.0–49.7)
MCH: 32 pg (ref 25.1–34.0)
MCHC: 33.4 g/dL (ref 31.5–36.0)
MCV: 95.8 fL (ref 79.5–101.0)
MONO#: 0.6 10*3/uL (ref 0.1–0.9)
MONO%: 14.9 % — ABNORMAL HIGH (ref 0.0–14.0)
NEUT%: 54.5 % (ref 38.4–76.8)
NEUTROS ABS: 2.1 10*3/uL (ref 1.5–6.5)
Platelets: 175 10*3/uL (ref 145–400)
RBC: 4.35 10*6/uL (ref 3.70–5.45)
RDW: 12 % (ref 11.2–14.5)
WBC: 3.9 10*3/uL (ref 3.9–10.3)

## 2015-07-27 LAB — COMPREHENSIVE METABOLIC PANEL (CC13)
ALBUMIN: 3.7 g/dL (ref 3.5–5.0)
ALK PHOS: 52 U/L (ref 40–150)
ALT: 12 U/L (ref 0–55)
AST: 16 U/L (ref 5–34)
Anion Gap: 7 mEq/L (ref 3–11)
BUN: 12.6 mg/dL (ref 7.0–26.0)
CALCIUM: 9.3 mg/dL (ref 8.4–10.4)
CO2: 31 mEq/L — ABNORMAL HIGH (ref 22–29)
Chloride: 103 mEq/L (ref 98–109)
Creatinine: 0.7 mg/dL (ref 0.6–1.1)
EGFR: >90 mL/min/{1.73_m2} (ref >90)
Glucose: 100 mg/dl (ref 70–140)
POTASSIUM: 4.3 meq/L (ref 3.5–5.1)
Sodium: 141 mEq/L (ref 136–145)
TOTAL PROTEIN: 6.5 g/dL (ref 6.4–8.3)
Total Bilirubin: 0.3 mg/dL (ref 0.20–1.20)

## 2015-07-27 NOTE — Telephone Encounter (Signed)
Appointments made and avs printed for patient,[facility] 09/26/15 8:15,order faxed

## 2015-07-27 NOTE — Progress Notes (Signed)
Brandi Chen  [phone] [phone]     ID: Brandi Chen DOB: 04-04-1961  MR#: [id number]  BSJ#:[id number]  Patient Care Team: Lona Kettle, MD as PCP - General (Family Medicine) [name], MD as Consulting Physician (General Surgery) Chauncey Cruel, MD as Consulting Physician (Oncology) Thea Silversmith, MD as Consulting Physician (Radiation Oncology) [name], MD as Consulting Physician (Gynecology)  CHIEF COMPLAINT: Newly diagnosed breast cancer  CURRENT TREATMENT: Completing radiation, resuming tamoxifen   BREAST CANCER HISTORY: From the original intake note:  Brandi Chen herself noted a change in her right breast while putting on a bathing suit. She immediately contacted Dr. Phineas Real, who works here in and confirmed a mass in the upper outer quadrant of the right breast, which she was unable to aspirate. He set the patient up for mammography at the [facility] Center 05/03/2014, and this confirmed a mass with lobulated margins in the upper outer quadrant which was easily palpable and which by ultrasound was hypoechoic and microlobulated, measuring 3.7 cm. The right axilla was unremarkable.  Biopsy of this mass 05/04/2014 showed ([id number]) and invasive ductal carcinoma, grade 2, estrogen and progesterone receptor positive, with an MIB-1 of 15%, and no HER-2 amplification, the signals ratio being 0.94 and the number per cell 1.70.  The patient's subsequent history is as detailed below.  INTERVAL HISTORY: Brandi Chen returns for follow up of her breast cancer.  She was restarted on tamoxifen 4 months ago after her last visit. She believes her quality of life has deteriorated since restarting this pill. She is having increased anxiety, depression, crying spells, nausea, lack of appetite, and mental fogginess. She had a job at a vet's office, and she was recently fired because she kept forgetting tasks assigned to her. She had no memory issues prior to  the tamoxifen.   REVIEW OF SYSTEMS: Brandi Chen denies fevers or chills. She is moving her bowels well. She is used to having "charlie horse" cramps to her lower legs, but now her upper thighs are painful at times. She denies shortness of breath, chest pain, cough, or palpitations. She has no headaches, dizziness, or vision changes. She used to be a vegetarian, now vegetables make her sick. She craves donuts often. She is under stress from a roommate that she does not get along with. She is moving on her own this upcoming December, but is having to reach into her 401K to do so. A detailed review of systems is otherwise stable.  PAST MEDICAL HISTORY: Past Medical History  Diagnosis Date  . ADD (attention deficit disorder)   . Anxiety   . Depression   . Breast cancer 04/2014    right  . Wears glasses   . Complication of anesthesia     wakes up wild from gas  . Radiation 02/09/15-03/11/15    Right breast  x 33 fractions 45+16 Gy    PAST SURGICAL HISTORY: Past Surgical History  Procedure Laterality Date  . Basal cell excised    . Multiple tooth extractions    . Breast lumpectomy with axillary lymph node biopsy Right 12/21/2014    Procedure: RIGHT BREAST LUMPECTOMY WITH AXILLARY LYMPH NODE BIOPSY;  Surgeon: [name], MD;  Location: [facility];  Service: General;  Laterality: Right;    FAMILY HISTORY Family History  Problem Relation Age of Onset  . Cancer Mother     bladder  . Hypertension Father   . Heart attack Father   . Breast cancer Paternal Aunt  40  . Cancer Maternal Grandmother     ovarian/uterine   the patient's father died at the age of 54 either from a myocardial infarction or a stroke. The patient's mother died at the age of 54 from metastatic bladder cancer. She was a heavy smoker. The patient has one brother and one sister. There is no other cancer in the immediate family, although on the mother's side 1 and was diagnosed with what may have been cervical or ovarian  cancer late in life, and on the father's side there was an aunt with breast cancer diagnosed in her late 15s  GYNECOLOGIC HISTORY:  Patient's last menstrual period was 08/27/2008. Menarche age 9, the patient is GX P0. She stopped having periods in 2009. She did not take hormone replacement. She did take Depo-Provera for many years because of heavy periods remotely.  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 She is starting her on business which includes dog sitting, painting, doing grocery shopping, and generally being a "girl Friday". She is divorced and lives with a roommate Brandi Chen who is disabled secondary to fibromyalgia. The patient's husband Brandi Chen, who is a survivor of colon cancer, has his own locksmith business. The patient's sister Brandi Chen is a stay-at-home mom here in [place]. The patient's brother lives in [place] where he works in conservation. The patient attends a [employer]    ADVANCED DIRECTIVES: Not in place. At the time of the patient's 03/02/2015 visit she requested another copy of the advanced directive papers that she could complete and have notarize the same day. She was referred to social work regarding this.   HEALTH MAINTENANCE: Social History  Substance Use Topics  . Smoking status: Never Smoker   . Smokeless tobacco: Never Used  . Alcohol Use: 4.2 oz/week    7 Glasses of wine per week     Comment: social     Colonoscopy:  PAP:  Bone density:  Lipid panel:  No Known Allergies  Current Outpatient Prescriptions  Medication Sig Dispense Refill  . amphetamine-dextroamphetamine (ADDERALL) 10 MG tablet Take 20 mg by mouth 2 (two) times daily.     . cholecalciferol (VITAMIN D) 1000 UNITS tablet Take 1,000 Units by mouth daily. Takes  2 daily    . diazepam (VALIUM) 10 MG tablet Take 10 mg by mouth every 12 (twelve) hours as needed. 2 tablets (49m) in am may repeat 16min afternoon if  needed    . divalproex (DEPAKOTE ER) 500 MG 24 hr tablet Take 500 mg by mouth daily. Pt takes 3 tablets once daily.    . Marland Kitchenlucosamine-chondroitin 500-400 MG tablet Take 1 tablet by mouth 3 (three) times daily.      . tamoxifen (NOLVADEX) 20 MG tablet TAKE 1 TABLET BY MOUTH DAILY 90 tablet 0   No current facility-administered medications for this visit.    OBJECTIVE: Middle-aged white woman who appears stated age Fi20itals:   07/27/15 0839  BP: 117/81  Pulse: 81  Temp: 98.3 F (36.8 C)  Resp: 18     Body mass index is 24.76 kg/(m^2).    ECOG FS:1 - Symptomatic but completely ambulatory  Skin: warm, dry  HEENT: sclerae anicteric, conjunctivae pink, oropharynx clear. No thrush or mucositis.  Lymph Nodes: No cervical or supraclavicular lymphadenopathy  Lungs: clear to auscultation bilaterally, no rales, wheezes, or rhonci  Heart: regular rate and rhythm  Abdomen: round, soft, non  tender, positive bowel sounds  Musculoskeletal: No focal spinal tenderness, no peripheral edema  Neuro: non focal, well oriented, positive affect  Breast: right breast status post lumpectomy. Right nipple is off center due to surgery. No evidence of recurrent disease. Right axilla benign. Left breast unremarkable.  LAB RESULTS:  CMP     Component Value Date/Time   NA 141 07/27/2015 0827   NA 137 09/30/2014 1131   K 4.3 07/27/2015 0827   K 4.0 09/30/2014 1131   CL 97 09/30/2014 1131   CO2 31* 07/27/2015 0827   CO2 30 09/30/2014 1131   GLUCOSE 100 07/27/2015 0827   GLUCOSE 91 09/30/2014 1131   BUN 12.6 07/27/2015 0827   BUN 14 09/30/2014 1131   CREATININE 0.7 07/27/2015 0827   CREATININE 0.71 09/30/2014 1131   CALCIUM 9.3 07/27/2015 0827   CALCIUM 9.0 09/30/2014 1131   PROT 6.5 07/27/2015 0827   PROT 6.5 09/30/2014 1131   ALBUMIN 3.7 07/27/2015 0827   ALBUMIN 4.0 09/30/2014 1131   AST 16 07/27/2015 0827   AST 14 09/30/2014 1131   ALT 12 07/27/2015 0827   ALT 9 09/30/2014 1131   ALKPHOS 52  07/27/2015 0827   ALKPHOS 41 09/30/2014 1131   BILITOT 0.30 07/27/2015 0827   BILITOT 0.4 09/30/2014 1131    I No results found for: SPEP  Lab Results  Component Value Date   WBC 3.9 07/27/2015   NEUTROABS 2.1 07/27/2015   HGB 13.9 07/27/2015   HCT 41.6 07/27/2015   MCV 95.8 07/27/2015   PLT 175 07/27/2015      Chemistry      Component Value Date/Time   NA 141 07/27/2015 0827   NA 137 09/30/2014 1131   K 4.3 07/27/2015 0827   K 4.0 09/30/2014 1131   CL 97 09/30/2014 1131   CO2 31* 07/27/2015 0827   CO2 30 09/30/2014 1131   BUN 12.6 07/27/2015 0827   BUN 14 09/30/2014 1131   CREATININE 0.7 07/27/2015 0827   CREATININE 0.71 09/30/2014 1131      Component Value Date/Time   CALCIUM 9.3 07/27/2015 0827   CALCIUM 9.0 09/30/2014 1131   ALKPHOS 52 07/27/2015 0827   ALKPHOS 41 09/30/2014 1131   AST 16 07/27/2015 0827   AST 14 09/30/2014 1131   ALT 12 07/27/2015 0827   ALT 9 09/30/2014 1131   BILITOT 0.30 07/27/2015 0827   BILITOT 0.4 09/30/2014 1131       No results found for: LABCA2  No components found for: LABCA125  No results for input(s): INR in the last 168 hours.  Urinalysis    Component Value Date/Time   COLORURINE YELLOW 09/30/2014 [place] 09/30/2014 1131   LABSPEC 1.017 09/30/2014 1131   PHURINE 6.5 09/30/2014 1131   GLUCOSEU NEG 09/30/2014 1131   HGBUR NEG 09/30/2014 1131   BILIRUBINUR NEG 09/30/2014 1131   KETONESUR NEG 09/30/2014 1131   PROTEINUR NEG 09/30/2014 1131   UROBILINOGEN 0.2 09/30/2014 1131   NITRITE NEG 09/30/2014 1131   LEUKOCYTESUR NEG 09/30/2014 1131    STUDIES: No results found.  ASSESSMENT: 54 y.o. [place] woman status post right upper outer quadrant breast biopsy 05/04/2014 for a clinical T2 N0, stage IIA invasive ductal carcinoma, grade 2, estrogen and progesterone receptor positive, HER-2 not amplified, with an MIB-1 of 15%  (1) Oncotype score of 19 predicts an out of the breast recurrence within  10 years of 12% if the patient's only systemic therapy is tamoxifen for 5  years. It also predicts an additional 3-4% risk reduction with CAF chemotherapy  (2) the patient opted against adjuvant chemotherapy  (3) started neoadjuvant tamoxifen 06/04/2014, held during adjuvant radiation  (4) status post right lumpectomy and sentinel lymph node sampling for a pT2 pN0, stage IIA invasive ductal carcinoma, grade 2, repeat prognostic panel again estrogen and progesterone receptor positive, HER-2 negative, with a focally present anterior soft tissue resection margin (skin)  (5) adjuvant radiation to be completed 03/11/2015  (6) Resumed tamoxifen 03/23/2015. Stopped 07/27/15 because of intolerance.  PLAN: Brandi Chen is having a rough time, from various stressors including recently losing her job. While she realizes 25% of her mental and physical ails are likely the result of poor diet and exercises habits, she firmly believes that 75% of it is related to tamoxifen. She would like to try an aromatase inhibitor instead, and as she is post menopausal this is certainly an option. However, we agreed that it would be best to take the next 4-6 weeks off to allow her to return to her natural baseline.   During this time she will seek the help of Dr. Casimiro Needle for possible adjustment of her depakote, begin to walk her dog more for physical activity, and make better choices when it comes to food. She is going to start out with a protein supplement daily.   She is due for a bilateral mammogram this fall, so I have placed orders for this to be performed at [facility] since she is having insurance trouble with the [place]. When she return in December, we will discuss initiating aromatase inhibitor therapy. She understands and agrees with this plan. She knows the goal of treatment in her case is cure. She has been encourage to call with any issues that might arise before her next visit here.  Total time spent in this appointment  was 30 minutes, with greater than 50% of the time spent face to face with the patient.  Laurie Panda, NP   07/27/2015 9:29 AM

## 2015-08-01 ENCOUNTER — Other Ambulatory Visit: Payer: Self-pay | Admitting: Nurse Practitioner

## 2015-08-08 ENCOUNTER — Encounter: Payer: Self-pay | Admitting: *Deleted

## 2015-08-08 NOTE — Progress Notes (Signed)
[employer]  Holiday representative met with patient in office at [facility] to offer support.  Patient requested to see CSW to discuss her living and employment situation.  Patient was tearful when discussing her job history and frustrations.  Patient also stated she was not happy in her current living situation, but has made other arrangements and will be moving to a new apartment soon.  Patient credited most of her "job struggles" to her medication.  Patient recently met with the physician and reports her medication is being changed.   CSW offered emotional support and allowed patient to freely express her feelings.  CSW and patient discussed opportunities for the further and focusing on the positive things.  Patient plans to keep job searching and will notify CSW as any additional concerns arise.    Johnnye Lana, MSW, LCSW, OSW-C Clinical Social Worker [facility] [phone]

## 2015-08-18 ENCOUNTER — Telehealth: Payer: Self-pay | Admitting: *Deleted

## 2015-08-18 DIAGNOSIS — C50919 Malignant neoplasm of unspecified site of unspecified female breast: Secondary | ICD-10-CM

## 2015-08-18 NOTE — Telephone Encounter (Signed)
Okay to schedule

## 2015-08-18 NOTE — Telephone Encounter (Signed)
Pt has annual scheduled on 10/05/15 history breast cancer, requesting to have bone density scheduled, per note on 10/01/15 pt will repeat Dexa at age 54. Pt said oncologist suggested sooner dexa, pt saw NP Gentry Fitz on 07/27/15 and I didn't see where she mention this in her note. Okay to schedule bone density? If so okay to schedule before annual? Please advise

## 2015-08-18 NOTE — Telephone Encounter (Signed)
Order placed donna will contact to schedule.

## 2015-08-25 ENCOUNTER — Other Ambulatory Visit: Payer: Self-pay | Admitting: Gynecology

## 2015-08-25 ENCOUNTER — Ambulatory Visit (INDEPENDENT_AMBULATORY_CARE_PROVIDER_SITE_OTHER): Payer: 59

## 2015-08-25 DIAGNOSIS — Z13820 Encounter for screening for osteoporosis: Secondary | ICD-10-CM | POA: Diagnosis not present

## 2015-08-25 DIAGNOSIS — C50919 Malignant neoplasm of unspecified site of unspecified female breast: Secondary | ICD-10-CM

## 2015-09-02 ENCOUNTER — Telehealth: Payer: Self-pay | Admitting: *Deleted

## 2015-09-02 NOTE — Telephone Encounter (Signed)
Called patient to let her know that bone density test was normal. She verbalized understanding.

## 2015-09-27 ENCOUNTER — Encounter: Payer: Self-pay | Admitting: Gynecology

## 2015-09-28 ENCOUNTER — Ambulatory Visit (HOSPITAL_BASED_OUTPATIENT_CLINIC_OR_DEPARTMENT_OTHER): Payer: 59 | Admitting: Oncology

## 2015-09-28 ENCOUNTER — Telehealth: Payer: Self-pay | Admitting: Oncology

## 2015-09-28 ENCOUNTER — Other Ambulatory Visit (HOSPITAL_BASED_OUTPATIENT_CLINIC_OR_DEPARTMENT_OTHER): Payer: 59

## 2015-09-28 VITALS — BP 112/78 | HR 87 | Temp 98.1°F | Resp 20 | Ht 65.0 in | Wt 153.0 lb

## 2015-09-28 DIAGNOSIS — Z170 Estrogen receptor positive status [ER+]: Secondary | ICD-10-CM

## 2015-09-28 DIAGNOSIS — C50919 Malignant neoplasm of unspecified site of unspecified female breast: Secondary | ICD-10-CM | POA: Diagnosis not present

## 2015-09-28 DIAGNOSIS — C50411 Malignant neoplasm of upper-outer quadrant of right female breast: Secondary | ICD-10-CM | POA: Diagnosis not present

## 2015-09-28 LAB — COMPREHENSIVE METABOLIC PANEL
ALT: 13 U/L (ref 0–55)
ANION GAP: 10 meq/L (ref 3–11)
AST: 18 U/L (ref 5–34)
Albumin: 3.8 g/dL (ref 3.5–5.0)
Alkaline Phosphatase: 68 U/L (ref 40–150)
BUN: 12.5 mg/dL (ref 7.0–26.0)
CALCIUM: 9.4 mg/dL (ref 8.4–10.4)
CHLORIDE: 101 meq/L (ref 98–109)
CO2: 29 meq/L (ref 22–29)
Creatinine: 0.8 mg/dL (ref 0.6–1.1)
EGFR: 82 mL/min/{1.73_m2} — ABNORMAL LOW (ref >90)
Glucose: 88 mg/dl (ref 70–140)
Potassium: 4.3 mEq/L (ref 3.5–5.1)
Sodium: 140 mEq/L (ref 136–145)
Total Bilirubin: 0.35 mg/dL (ref 0.20–1.20)
Total Protein: 6.8 g/dL (ref 6.4–8.3)

## 2015-09-28 LAB — CBC WITH DIFFERENTIAL/PLATELET
BASO%: 0.5 % (ref 0.0–2.0)
BASOS ABS: 0 10*3/uL (ref 0.0–0.1)
EOS%: 0.8 % (ref 0.0–7.0)
Eosinophils Absolute: 0 10*3/uL (ref 0.0–0.5)
HEMATOCRIT: 41.1 % (ref 34.8–46.6)
HGB: 13.7 g/dL (ref 11.6–15.9)
LYMPH#: 1.6 10*3/uL (ref 0.9–3.3)
LYMPH%: 34.1 % (ref 14.0–49.7)
MCH: 31.9 pg (ref 25.1–34.0)
MCHC: 33.3 g/dL (ref 31.5–36.0)
MCV: 95.9 fL (ref 79.5–101.0)
MONO#: 0.7 10*3/uL (ref 0.1–0.9)
MONO%: 14.2 % — ABNORMAL HIGH (ref 0.0–14.0)
NEUT#: 2.3 10*3/uL (ref 1.5–6.5)
NEUT%: 50.4 % (ref 38.4–76.8)
PLATELETS: 197 10*3/uL (ref 145–400)
RBC: 4.28 10*6/uL (ref 3.70–5.45)
RDW: 11.7 % (ref 11.2–14.5)
WBC: 4.6 10*3/uL (ref 3.9–10.3)

## 2015-09-28 MED ORDER — ANASTROZOLE 1 MG PO TABS: 1.0000 mg | ORAL_TABLET | Freq: Every day | ORAL | Status: AC

## 2015-09-28 NOTE — Telephone Encounter (Signed)
Spoke with PCP regarding obtaining autho at [place] at [place] (christy)

## 2015-09-28 NOTE — Progress Notes (Signed)
[facility]  Telephone:([phone]) [phone] Fax:(336) [phone]     ID: Brandi Chen DOB: [birth year]  MR#: [id number]  INO#:[id number]  Patient Care Team: Lona Kettle, MD as PCP - General (Family Medicine) [name], MD as Consulting Physician (General Surgery) Chauncey Cruel, MD as Consulting Physician (Oncology) Thea Silversmith, MD as Consulting Physician (Radiation Oncology) [name], MD as Consulting Physician (Gynecology)  CHIEF COMPLAINT: Newly diagnosed breast cancer  CURRENT TREATMENT: anastrozole  BREAST CANCER HISTORY: From the original intake note:  Brandi Chen herself noted a change in her right breast while putting on a bathing suit. She immediately contacted Dr. Phineas Real, who works here in and confirmed a mass in the upper outer quadrant of the right breast, which she was unable to aspirate. He set the patient up for mammography at the [facility] Center 05/03/2014, and this confirmed a mass with lobulated margins in the upper outer quadrant which was easily palpable and which by ultrasound was hypoechoic and microlobulated, measuring 3.7 cm. The right axilla was unremarkable.  Biopsy of this mass 05/04/2014 showed ([id number]) and invasive ductal carcinoma, grade 2, estrogen and progesterone receptor positive, with an MIB-1 of 15%, and no HER-2 amplification, the signals ratio being 0.94 and the number per cell 1.70.  The patient's subsequent history is as detailed below.  INTERVAL HISTORY: Brandi Chen returns for follow up of herestrogen receptor positive breast cancer.  She went off tamoxifen 2 months ago when last seen so we could assess whether that would improve the various symptoms she thought were related to that medicine. In point of fact everything is if anything worse. She is more forgetful more anxious and more depressed. She went to an urologist regarding this. She has lost her job since her last visit here because she says she could not concentrate and  Doing at the same things over and over again not realizing that she had a ready done them. She is very concerned because of financial issues, but her family has provided her with a free home in [place] and her plan is to move there within the week. She is currently trying to sell or giveaway all her belongings.  REVIEW OF SYSTEMS: Kimis sleeping "too much". She hurts in the front of her thighs, and sometimes when walking. This is very inconstant. The pain can be throbbing or achy or cramping. She sleeps on 2 pillows. Her appetite is poor. Sometimes she is constipated and sometimes she has diarrhea. She bruises easily. A detailed review of systems today was otherwise stable.  PAST MEDICAL HISTORY: Past Medical History  Diagnosis Date  . ADD (attention deficit disorder)   . Anxiety   . Depression   . Breast cancer ([facility]) 04/2014    right  . Wears glasses   . Complication of anesthesia     wakes up wild from gas  . Radiation 02/09/15-03/11/15    Right breast  x 33 fractions 45+16 Gy    PAST SURGICAL HISTORY: Past Surgical History  Procedure Laterality Date  . Basal cell excised    . Multiple tooth extractions    . Breast lumpectomy with axillary lymph node biopsy Right 12/21/2014    Procedure: RIGHT BREAST LUMPECTOMY WITH AXILLARY LYMPH NODE BIOPSY;  Surgeon: [name], MD;  Location: [facility];  Service: General;  Laterality: Right;    FAMILY HISTORY Family History  Problem Relation Age of Onset  . Cancer Mother     bladder  . Hypertension Father   .  Heart attack Father   . Breast cancer Paternal Aunt 24  . Cancer Maternal Grandmother     ovarian/uterine   the patient's father died at the age of 10 either from a myocardial infarction or a stroke. The patient's mother died at the age of 22 from metastatic bladder cancer. She was a heavy smoker. The patient has one brother and one sister. There is no other cancer in the immediate family, although on the mother's side 1  and was diagnosed with what may have been cervical or ovarian cancer late in life, and on the father's side there was an aunt with breast cancer diagnosed in her late 42s  GYNECOLOGIC HISTORY:  Patient's last menstrual period was 08/27/2008. Menarche age 73, the patient is GX P0. She stopped having periods in 2009. She did not take hormone replacement. She did take Depo-Provera for many years because of heavy periods remotely.  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 She is starting her on business which includes dog sitting, painting, doing grocery shopping, and generally being a "girl Friday". She is divorced and lives with a roommate [name] who is disabled secondary to fibromyalgia. The patient's husband Barnabas Lister, who is a survivor of colon cancer, has his own locksmith business. The patient's sister Erby Pian is a stay-at-home mom here in [place]. The patient's brother lives in [place] where he works in conservation. The patient attends a [employer]    ADVANCED DIRECTIVES: Not in place. At the time of the patient's 03/02/2015 visit she requested another copy of the advanced directive papers that she could complete and have notarize the same day. She was referred to social work regarding this.   HEALTH MAINTENANCE: Social History  Substance Use Topics  . Smoking status: Never Smoker   . Smokeless tobacco: Never Used  . Alcohol Use: 4.2 oz/week    7 Glasses of wine per week     Comment: social     Colonoscopy:  PAP:  Bone density:  Lipid panel:  No Known Allergies  Current Outpatient Prescriptions  Medication Sig Dispense Refill  . amphetamine-dextroamphetamine (ADDERALL) 10 MG tablet Take 20 mg by mouth 2 (two) times daily.     . cholecalciferol (VITAMIN D) 1000 UNITS tablet Take 1,000 Units by mouth daily. Takes  2 daily    . diazepam (VALIUM) 10 MG tablet Take 10 mg by mouth every 12 (twelve) hours as  needed. 2 tablets (41m) in am may repeat 132min afternoon if needed    . divalproex (DEPAKOTE ER) 500 MG 24 hr tablet Take 500 mg by mouth daily. Pt takes 3 tablets once daily.    . Marland Kitchenlucosamine-chondroitin 500-400 MG tablet Take 1 tablet by mouth 3 (three) times daily.      . Multiple Vitamins-Minerals (WOMENS 50+ MULTI VITAMIN/MIN PO) Take 1 tablet by mouth daily.    . tamoxifen (NOLVADEX) 20 MG tablet TAKE 1 TABLET BY MOUTH DAILY 90 tablet 0   No current facility-administered medications for this visit.    OBJECTIVE: Middle-aged white woman  Filed Vitals:   09/28/15 1016  BP: 112/78  Pulse: 87  Temp: 98.1 F (36.7 C)  Resp: 20     Body mass index is 25.46 kg/(m^2).    ECOG FS:1 - Symptomatic but completely ambulatory  Sclerae unicteric, pupils round and equal Oropharynx clear and moist-- no thrush or other lesions No cervical or supraclavicular adenopathy Lungs  no rales or rhonchi Heart regular rate and rhythm Abd soft, nontender, positive bowel sounds MSK no focal spinal tenderness, no upper extremity lymphedema Neuro: nonfocal, well oriented, appropriate affect Breasts: the right breast is status post lumpectomy and radiation. There is distortion of the breast contour as previously noted.There is no evidence of local recurrence. The right axilla is benign per the left breast is unremarkable.    LAB RESULTS:  CMP     Component Value Date/Time   NA 140 09/28/2015 0957   NA 137 09/30/2014 1131   K 4.3 09/28/2015 0957   K 4.0 09/30/2014 1131   CL 97 09/30/2014 1131   CO2 29 09/28/2015 0957   CO2 30 09/30/2014 1131   GLUCOSE 88 09/28/2015 0957   GLUCOSE 91 09/30/2014 1131   BUN 12.5 09/28/2015 0957   BUN 14 09/30/2014 1131   CREATININE 0.8 09/28/2015 0957   CREATININE 0.71 09/30/2014 1131   CALCIUM 9.4 09/28/2015 0957   CALCIUM 9.0 09/30/2014 1131   PROT 6.8 09/28/2015 0957   PROT 6.5 09/30/2014 1131   ALBUMIN 3.8 09/28/2015 0957   ALBUMIN 4.0 09/30/2014 1131    AST 18 09/28/2015 0957   AST 14 09/30/2014 1131   ALT 13 09/28/2015 0957   ALT 9 09/30/2014 1131   ALKPHOS 68 09/28/2015 0957   ALKPHOS 41 09/30/2014 1131   BILITOT 0.35 09/28/2015 0957   BILITOT 0.4 09/30/2014 1131    I No results found for: SPEP  Lab Results  Component Value Date   WBC 4.6 09/28/2015   NEUTROABS 2.3 09/28/2015   HGB 13.7 09/28/2015   HCT 41.1 09/28/2015   MCV 95.9 09/28/2015   PLT 197 09/28/2015      Chemistry      Component Value Date/Time   NA 140 09/28/2015 0957   NA 137 09/30/2014 1131   K 4.3 09/28/2015 0957   K 4.0 09/30/2014 1131   CL 97 09/30/2014 1131   CO2 29 09/28/2015 0957   CO2 30 09/30/2014 1131   BUN 12.5 09/28/2015 0957   BUN 14 09/30/2014 1131   CREATININE 0.8 09/28/2015 0957   CREATININE 0.71 09/30/2014 1131      Component Value Date/Time   CALCIUM 9.4 09/28/2015 0957   CALCIUM 9.0 09/30/2014 1131   ALKPHOS 68 09/28/2015 0957   ALKPHOS 41 09/30/2014 1131   AST 18 09/28/2015 0957   AST 14 09/30/2014 1131   ALT 13 09/28/2015 0957   ALT 9 09/30/2014 1131   BILITOT 0.35 09/28/2015 0957   BILITOT 0.4 09/30/2014 1131       No results found for: LABCA2  No components found for: LABCA125  No results for input(s): INR in the last 168 hours.  Urinalysis    Component Value Date/Time   COLORURINE YELLOW 09/30/2014 [place] 09/30/2014 1131   LABSPEC 1.017 09/30/2014 1131   PHURINE 6.5 09/30/2014 1131   GLUCOSEU NEG 09/30/2014 1131   HGBUR NEG 09/30/2014 1131   BILIRUBINUR NEG 09/30/2014 1131   KETONESUR NEG 09/30/2014 1131   PROTEINUR NEG 09/30/2014 1131   UROBILINOGEN 0.2 09/30/2014 1131   NITRITE NEG 09/30/2014 1131   LEUKOCYTESUR NEG 09/30/2014 1131    STUDIES:  Evaluation of a palpable area in the right breast as well as bilateral diagnostic mammography with tomosynthesis at [facility] 09/26/2015 showed only posttreatment changes in the right breast. As far as the palpable area was concerned , there  was only post surgical distortion. Nevertheless an ultrasound was obtained  which showed scar tissue. She also had a bone density 08/25/2015 and [facility] which was normal.  ASSESSMENT: 54 y.o. [place] woman status post right upper outer quadrant breast biopsy 05/04/2014 for a clinical T2 N0, stage IIA invasive ductal carcinoma, grade 2, estrogen and progesterone receptor positive, HER-2 not amplified, with an MIB-1 of 15%  (1) Oncotype score of 19 predicts an out of the breast recurrence within 10 years of 12% if the patient's only systemic therapy is tamoxifen for 5 years. It also predicts an additional 3-4% risk reduction with CAF chemotherapy  (2) the patient opted against adjuvant chemotherapy  (3) started neoadjuvant tamoxifen 06/04/2014, held during adjuvant radiation  (4) status post right lumpectomy and sentinel lymph node sampling for a pT2 pN0, stage IIA invasive ductal carcinoma, grade 2, repeat prognostic panel again estrogen and progesterone receptor positive, HER-2 negative, with a focally present anterior soft tissue resection margin (skin)  (5) adjuvant radiation 02/09/2015-03/11/2015 Right breast / 45 Gray @ 1.8 [name] per fraction x 25 fractions Right breast boost / 16 Gray at [employer] per fraction x 8 fractions  (6) Resumed tamoxifen 03/23/2015. Stopped 07/27/15 because of intolerance.  (7) to start anastrozole 10/22/2014.  PLAN: Brandi Chen is going through a fairly chaotic transition which will lead to relocation in [place]. We spent about 40 minutes trying to sort things out.Her address there will be [address] [place] [address]. That is a family property that is being open to her free of charge. She hopes to find a job there and today I gave her the address his and phone numbers of 2 oncology clinics in [place], which is nearby. She will call for an appointment there shortly after moving. She understands we will be glad to forward all our records  there but today I gave her a copy of her pathology, and her most recent radiology studies, as well as a summary of her treatments to date.  Even though she went off the tamoxifen, her general symptoms of anxiety and depression as well as forgetfulness did not improve. Nevertheless she refuses to go back on that medication.  I have gone ahead and written her a prescription for anastrozole. Once things settle down a little and she feels more stable she should initiate that and continue it for 5 years.  I have not made Brandi Chen a return appointment here but she knows I will be glad to see her anytime she is in town if that would be helpful and that we are also available to her by phone even if she is living in [place]. Chauncey Cruel, MD   09/28/2015 10:39 AM

## 2015-10-05 ENCOUNTER — Ambulatory Visit (INDEPENDENT_AMBULATORY_CARE_PROVIDER_SITE_OTHER): Payer: 59 | Admitting: Gynecology

## 2015-10-05 ENCOUNTER — Encounter: Payer: Self-pay | Admitting: Gynecology

## 2015-10-05 VITALS — BP 118/74 | Ht 65.0 in | Wt 155.0 lb

## 2015-10-05 DIAGNOSIS — Z01419 Encounter for gynecological examination (general) (routine) without abnormal findings: Secondary | ICD-10-CM

## 2015-10-05 DIAGNOSIS — C50911 Malignant neoplasm of unspecified site of right female breast: Secondary | ICD-10-CM | POA: Diagnosis not present

## 2015-10-05 NOTE — Addendum Note (Signed)
Addended by: Nelva Nay on: 10/05/2015 09:17 AM   Modules accepted: Orders

## 2015-10-05 NOTE — Progress Notes (Signed)
Brandi Chen September 14, 1961 [id number]        54 y.o.  G0P0  for annual exam.  Doing well.  Past medical history,surgical history, problem list, medications, allergies, family history and social history were all reviewed and documented as reviewed in the EPIC chart.  ROS:  Performed with pertinent positives and negatives included in the history, assessment and plan.   Additional significant findings :  none   Exam: Brandi Chen Vitals:   10/05/15 0822  BP: 118/74  Height: 5\' 5"  (1.651 m)  Weight: 155 lb (70.308 kg)   General appearance:  Normal affect, orientation and appearance. Skin: Grossly normal HEENT: Without gross lesions.  No cervical or supraclavicular adenopathy. Thyroid normal.  Lungs:  Clear without wheezing, rales or rhonchi Cardiac: RR, without RMG Abdominal:  Soft, nontender, without masses, guarding, rebound, organomegaly or hernia Breasts:  Examined lying and sitting without masses, retractions, discharge or axillary adenopathy.  Right breast with well-healed lumpectomy scar Pelvic:  Ext/BUS/vagina normal with mild atrophic changes  Cervix with mild atrophic changes  Uterus anteverted, normal size, shape and contour, midline and mobile nontender   Adnexa  Without masses or tenderness    Anus and perineum  Normal   Rectovaginal  Normal sphincter tone without palpated masses or tenderness.    Assessment/Plan:  54 y.o. G0P0 female for annual exam.   1. Postmenopausal. Doing well without significant hot flushes, night sweats, vaginal dryness or any vaginal bleeding. Continue to monitor and report any vaginal bleeding. 2. History of right breast cancer. Was on tamoxifen now on Arimidex. Had recent mammogram with follow up ultrasound which were negative. She had a palpable area in her right breast which is felt to be secondary to scarring from her lumpectomy. I do not feel any significant nodularity in this area now. Patient will continue with self breast exams  as long as her exam remains unchanged she'll follow up for mammography is recommended by her oncologist. 3. DEXA 2016 normal. Will repeat at her oncologist recommended interval due to being on Arimidex. Increased calcium vitamin D. 4. Pap smear 2014. Pap smear done today. No history of significant abnormal Pap smears previously. 5. Colonoscopy never. I recommended she schedule a screening colonoscopy. She is moving to [place] in a week or so. She'll go ahead and arrange this there once she gets settled. 6. Health maintenance. Patient reports recent full exam and blood work by Dr. Harrington Challenger. No lab work done today. Follow up in one year with a gynecologist in [place].   [name] MD, 8:54 AM 10/05/2015

## 2015-10-05 NOTE — Patient Instructions (Signed)
Schedule your colonoscopy once you get settled in [place] with a gastroenterologist  You may obtain a copy of any labs that were done today by logging onto MyChart as outlined in the instructions provided with your AVS (after visit summary). The office will not call with normal lab results but certainly if there are any significant abnormalities then we will contact you.   Health Maintenance Adopting a healthy lifestyle and getting preventive care can go a long way to promote health and wellness. Talk with your health care provider about what schedule of regular examinations is right for you. This is a good chance for you to check in with your provider about disease prevention and staying healthy. In between checkups, there are plenty of things you can do on your own. Experts have done a lot of research about which lifestyle changes and preventive measures are most likely to keep you healthy. Ask your health care provider for more information. WEIGHT AND DIET  Eat a healthy diet  Be sure to include plenty of vegetables, fruits, low-fat dairy products, and lean protein.  Do not eat a lot of foods high in solid fats, added sugars, or salt.  Get regular exercise. This is one of the most important things you can do for your health.  Most adults should exercise for at least 150 minutes each week. The exercise should increase your heart rate and make you sweat (moderate-intensity exercise).  Most adults should also do strengthening exercises at least twice a week. This is in addition to the moderate-intensity exercise.  Maintain a healthy weight  Body mass index (BMI) is a measurement that can be used to identify possible weight problems. It estimates body fat based on height and weight. Your health care provider can help determine your BMI and help you achieve or maintain a healthy weight.  For females 74 years of age and older:   A BMI below 18.5 is considered underweight.  A BMI of 18.5 to  24.9 is normal.  A BMI of 25 to 29.9 is considered overweight.  A BMI of 30 and above is considered obese.  Watch levels of cholesterol and blood lipids  You should start having your blood tested for lipids and cholesterol at 54 years of age, then have this test every 5 years.  You may need to have your cholesterol levels checked more often if:  Your lipid or cholesterol levels are high.  You are older than 54 years of age.  You are at high risk for heart disease.  CANCER SCREENING   Lung Cancer  Lung cancer screening is recommended for adults 41-40 years old who are at high risk for lung cancer because of a history of smoking.  A yearly low-dose CT scan of the lungs is recommended for people who:  Currently smoke.  Have quit within the past 15 years.  Have at least a 30-pack-year history of smoking. A pack year is smoking an average of one pack of cigarettes a day for 1 year.  Yearly screening should continue until it has been 15 years since you quit.  Yearly screening should stop if you develop a health problem that would prevent you from having lung cancer treatment.  Breast Cancer  Practice breast self-awareness. This means understanding how your breasts normally appear and feel.  It also means doing regular breast self-exams. Let your health care provider know about any changes, no matter how small.  If you are in your 20s or 30s, you should  have a clinical breast exam (CBE) by a health care provider every 1-3 years as part of a regular health exam.  If you are 65 or older, have a CBE every year. Also consider having a breast X-ray (mammogram) every year.  If you have a family history of breast cancer, talk to your health care provider about genetic screening.  If you are at high risk for breast cancer, talk to your health care provider about having an MRI and a mammogram every year.  Breast cancer gene (BRCA) assessment is recommended for women who have family  members with BRCA-related cancers. BRCA-related cancers include:  Breast.  Ovarian.  Tubal.  Peritoneal cancers.  Results of the assessment will determine the need for genetic counseling and BRCA1 and BRCA2 testing. Cervical Cancer Routine pelvic examinations to screen for cervical cancer are no longer recommended for nonpregnant women who are considered low risk for cancer of the pelvic organs (ovaries, uterus, and vagina) and who do not have symptoms. A pelvic examination may be necessary if you have symptoms including those associated with pelvic infections. Ask your health care provider if a screening pelvic exam is right for you.   The Pap test is the screening test for cervical cancer for women who are considered at risk.  If you had a hysterectomy for a problem that was not cancer or a condition that could lead to cancer, then you no longer need Pap tests.  If you are older than 65 years, and you have had normal Pap tests for the past 10 years, you no longer need to have Pap tests.  If you have had past treatment for cervical cancer or a condition that could lead to cancer, you need Pap tests and screening for cancer for at least 20 years after your treatment.  If you no longer get a Pap test, assess your risk factors if they change (such as having a new sexual partner). This can affect whether you should start being screened again.  Some women have medical problems that increase their chance of getting cervical cancer. If this is the case for you, your health care provider may recommend more frequent screening and Pap tests.  The human papillomavirus (HPV) test is another test that may be used for cervical cancer screening. The HPV test looks for the virus that can cause cell changes in the cervix. The cells collected during the Pap test can be tested for HPV.  The HPV test can be used to screen women 21 years of age and older. Getting tested for HPV can extend the interval  between normal Pap tests from three to five years.  An HPV test also should be used to screen women of any age who have unclear Pap test results.  After 54 years of age, women should have HPV testing as often as Pap tests.  Colorectal Cancer  This type of cancer can be detected and often prevented.  Routine colorectal cancer screening usually begins at 54 years of age and continues through 54 years of age.  Your health care provider may recommend screening at an earlier age if you have risk factors for colon cancer.  Your health care provider may also recommend using home test kits to check for hidden blood in the stool.  A small camera at the end of a tube can be used to examine your colon directly (sigmoidoscopy or colonoscopy). This is done to check for the earliest forms of colorectal cancer.  Routine screening usually  begins at age 68.  Direct examination of the colon should be repeated every 5-10 years through 54 years of age. However, you may need to be screened more often if early forms of precancerous polyps or small growths are found. Skin Cancer  Check your skin from head to toe regularly.  Tell your health care provider about any new moles or changes in moles, especially if there is a change in a mole's shape or color.  Also tell your health care provider if you have a mole that is larger than the size of a pencil eraser.  Always use sunscreen. Apply sunscreen liberally and repeatedly throughout the day.  Protect yourself by wearing long sleeves, pants, a wide-brimmed hat, and sunglasses whenever you are outside. HEART DISEASE, DIABETES, AND HIGH BLOOD PRESSURE   Have your blood pressure checked at least every 1-2 years. High blood pressure causes heart disease and increases the risk of stroke.  If you are between 48 years and 5 years old, ask your health care provider if you should take aspirin to prevent strokes.  Have regular diabetes screenings. This involves  taking a blood sample to check your fasting blood sugar level.  If you are at a normal weight and have a low risk for diabetes, have this test once every three years after 54 years of age.  If you are overweight and have a high risk for diabetes, consider being tested at a younger age or more often. PREVENTING INFECTION  Hepatitis B  If you have a higher risk for hepatitis B, you should be screened for this virus. You are considered at high risk for hepatitis B if:  You were born in a country where hepatitis B is common. Ask your health care provider which countries are considered high risk.  Your parents were born in a high-risk country, and you have not been immunized against hepatitis B (hepatitis B vaccine).  You have HIV or AIDS.  You use needles to inject street drugs.  You live with someone who has hepatitis B.  You have had sex with someone who has hepatitis B.  You get hemodialysis treatment.  You take certain medicines for conditions, including cancer, organ transplantation, and autoimmune conditions. Hepatitis C  Blood testing is recommended for:  Everyone born from [birth year] through 1965.  Anyone with known risk factors for hepatitis C. Sexually transmitted infections (STIs)  You should be screened for sexually transmitted infections (STIs) including gonorrhea and chlamydia if:  You are sexually active and are younger than 54 years of age.  You are older than 54 years of age and your health care provider tells you that you are at risk for this type of infection.  Your sexual activity has changed since you were last screened and you are at an increased risk for chlamydia or gonorrhea. Ask your health care provider if you are at risk.  If you do not have HIV, but are at risk, it may be recommended that you take a prescription medicine daily to prevent HIV infection. This is called pre-exposure prophylaxis (PrEP). You are considered at risk if:  You are sexually active  and do not regularly use condoms or know the HIV status of your partner(s).  You take drugs by injection.  You are sexually active with a partner who has HIV. Talk with your health care provider about whether you are at high risk of being infected with HIV. If you choose to begin PrEP, you should first be tested for  HIV. You should then be tested every 3 months for as long as you are taking PrEP.  PREGNANCY   If you are premenopausal and you may become pregnant, ask your health care provider about preconception counseling.  If you may become pregnant, take 400 to 800 micrograms (mcg) of folic acid every day.  If you want to prevent pregnancy, talk to your health care provider about birth control (contraception). OSTEOPOROSIS AND MENOPAUSE   Osteoporosis is a disease in which the bones lose minerals and strength with aging. This can result in serious bone fractures. Your risk for osteoporosis can be identified using a bone density scan.  If you are 71 years of age or older, or if you are at risk for osteoporosis and fractures, ask your health care provider if you should be screened.  Ask your health care provider whether you should take a calcium or vitamin D supplement to lower your risk for osteoporosis.  Menopause may have certain physical symptoms and risks.  Hormone replacement therapy may reduce some of these symptoms and risks. Talk to your health care provider about whether hormone replacement therapy is right for you.  HOME CARE INSTRUCTIONS   Schedule regular health, dental, and eye exams.  Stay current with your immunizations.   Do not use any tobacco products including cigarettes, chewing tobacco, or electronic cigarettes.  If you are pregnant, do not drink alcohol.  If you are breastfeeding, limit how much and how often you drink alcohol.  Limit alcohol intake to no more than 1 drink per day for nonpregnant women. One drink equals 12 ounces of beer, 5 ounces of wine,  or 1 ounces of hard liquor.  Do not use street drugs.  Do not share needles.  Ask your health care provider for help if you need support or information about quitting drugs.  Tell your health care provider if you often feel depressed.  Tell your health care provider if you have ever been abused or do not feel safe at home. Document Released: 04/23/2011 Document Revised: 02/22/2014 Document Reviewed: 09/09/2013 [facility] Patient Information 2015 [place], [place]. This information is not intended to replace advice given to you by your health care provider. Make sure you discuss any questions you have with your health care provider.

## 2015-11-01 ENCOUNTER — Inpatient Hospital Stay: Admission: RE | Admit: 2015-11-01 | Payer: 59 | Source: Ambulatory Visit

## 2019-07-21 ENCOUNTER — Encounter: Payer: Self-pay | Admitting: Gynecology
